# Patient Record
Sex: Male | Born: 1960 | Hispanic: No | Smoking: Current every day smoker
Health system: Southern US, Community
[De-identification: ages and names within clinical notes are randomized; demographics above are authoritative.]

## PROBLEM LIST (undated history)

## (undated) DIAGNOSIS — G8929 Other chronic pain: Secondary | ICD-10-CM

## (undated) DIAGNOSIS — E119 Type 2 diabetes mellitus without complications: Secondary | ICD-10-CM

## (undated) DIAGNOSIS — S46009A Unspecified injury of muscle(s) and tendon(s) of the rotator cuff of unspecified shoulder, initial encounter: Secondary | ICD-10-CM

## (undated) HISTORY — PX: KNEE SURGERY: SHX244

---
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
## 2017-01-18 ENCOUNTER — Encounter (HOSPITAL_COMMUNITY): Payer: Self-pay | Admitting: Emergency Medicine

## 2017-01-18 ENCOUNTER — Emergency Department (HOSPITAL_COMMUNITY)
Admission: EM | Admit: 2017-01-18 | Discharge: 2017-01-18 | Disposition: A | Discharge status: Home / Self Care | Payer: Non-veteran care | Attending: Emergency Medicine | Admitting: Emergency Medicine

## 2017-01-18 ENCOUNTER — Other Ambulatory Visit: Payer: Self-pay

## 2017-01-18 ENCOUNTER — Emergency Department (HOSPITAL_COMMUNITY): Payer: Non-veteran care

## 2017-01-18 DIAGNOSIS — R200 Anesthesia of skin: Secondary | ICD-10-CM | POA: Diagnosis not present

## 2017-01-18 DIAGNOSIS — F17210 Nicotine dependence, cigarettes, uncomplicated: Secondary | ICD-10-CM | POA: Diagnosis not present

## 2017-01-18 DIAGNOSIS — M5412 Radiculopathy, cervical region: Secondary | ICD-10-CM | POA: Insufficient documentation

## 2017-01-18 DIAGNOSIS — M25511 Pain in right shoulder: Secondary | ICD-10-CM | POA: Diagnosis present

## 2017-01-18 DIAGNOSIS — Z79899 Other long term (current) drug therapy: Secondary | ICD-10-CM | POA: Diagnosis not present

## 2017-01-18 LAB — CBC
HCT: 49.1 % (ref 39.0–52.0)
Hemoglobin: 16.3 g/dL (ref 13.0–17.0)
MCH: 28.3 pg (ref 26.0–34.0)
MCHC: 33.2 g/dL (ref 30.0–36.0)
MCV: 85.4 fL (ref 78.0–100.0)
Platelets: 240 10*3/uL (ref 150–400)
RBC: 5.75 MIL/uL (ref 4.22–5.81)
RDW: 14.1 % (ref 11.5–15.5)
WBC: 4.4 10*3/uL (ref 4.0–10.5)

## 2017-01-18 LAB — COMPREHENSIVE METABOLIC PANEL
ALK PHOS: 67 U/L (ref 38–126)
ALT: 28 U/L (ref 17–63)
ANION GAP: 13 (ref 5–15)
AST: 36 U/L (ref 15–41)
Albumin: 3.8 g/dL (ref 3.5–5.0)
BILIRUBIN TOTAL: 0.9 mg/dL (ref 0.3–1.2)
BUN: 10 mg/dL (ref 6–20)
CALCIUM: 9.1 mg/dL (ref 8.9–10.3)
CO2: 26 mmol/L (ref 22–32)
CREATININE: 0.93 mg/dL (ref 0.61–1.24)
Chloride: 101 mmol/L (ref 101–111)
GFR calc Af Amer: >60 mL/min (ref >60)
GFR calc non Af Amer: >60 mL/min (ref >60)
GLUCOSE: 116 mg/dL — AB (ref 65–99)
Potassium: 3.5 mmol/L (ref 3.5–5.1)
SODIUM: 140 mmol/L (ref 135–145)
TOTAL PROTEIN: 6.9 g/dL (ref 6.5–8.1)

## 2017-01-18 LAB — TROPONIN I: Troponin I: <0.03 ng/mL (ref <0.03)

## 2017-01-18 LAB — ETHANOL: Alcohol, Ethyl (B): 67 mg/dL — ABNORMAL HIGH (ref <5)

## 2017-01-18 MED ORDER — PREDNISONE 10 MG (21) PO TBPK: ORAL_TABLET | ORAL | 0 refills | Status: DC

## 2017-01-18 MED ORDER — IBUPROFEN 600 MG PO TABS: 600.0000 mg | ORAL_TABLET | Freq: Four times a day (QID) | ORAL | 0 refills | Status: DC | PRN

## 2017-01-18 MED ORDER — DEXAMETHASONE SODIUM PHOSPHATE 10 MG/ML IJ SOLN
10.0000 mg | Freq: Once | INTRAMUSCULAR | Status: AC
  Administered 2017-01-18: 10 mg via INTRAVENOUS
  Filled 2017-01-18: qty 1

## 2017-01-18 NOTE — ED Provider Notes (Signed)
[facility]-EMERGENCY DEPT Provider Note   CSN: [id number] Arrival date & time: 01/18/17  2033     History   Chief Complaint Chief Complaint  Patient presents with  . right shoulder pain, pain in right leg behind knee    HPI Manuel Long is a 56 y.o. male.  Pt presents to the ED today with right shoulder pain, numbness to right arm, and pain behind right knee.  The pt said he noticed it 2 hours ago.  He was sitting and eating dinner when it started.  The pt said that he does not have any medical problems or take meds.      History reviewed. No pertinent past medical history.  There are no active problems to display for this patient.   Past Surgical History:  Procedure Laterality Date  . KNEE SURGERY Left    basketball injury       Home Medications    Prior to Admission medications   Medication Sig Start Date End Date Taking? Authorizing Provider  ibuprofen (ADVIL,MOTRIN) 600 MG tablet Take 1 tablet (600 mg total) by mouth every 6 (six) hours as needed. 01/18/17   Jacalyn Lefevre, MD  predniSONE (STERAPRED UNI-PAK 21 TAB) 10 MG (21) TBPK tablet Take 6 tabs by mouth daily  for 2 days, then 5 tabs for 2 days, then 4 tabs for 2 days, then 3 tabs for 2 days, 2 tabs for 2 days, then 1 tab by mouth daily for 2 days 01/18/17   Jacalyn Lefevre, MD    Family History No family history on file.  Social History Social History  Substance Use Topics  . Smoking status: Current Every Day Smoker    Packs/day: 0.50    Years: 51.00    Types: Cigarettes  . Smokeless tobacco: Never Used  . Alcohol use 2.4 oz/week    4 Cans of beer per week     Comment: daily     Allergies   Coconut flavor and Bee venom   Review of Systems Review of Systems  Musculoskeletal:       Right shoulder pain  Neurological: Positive for numbness.  All other systems reviewed and are negative.    Physical Exam Updated Vital Signs BP 130/81 (BP Location: Left Arm)   Pulse 91   Temp 98.2 F  (36.8 C) (Oral)   Resp 16   Ht  (1.727 m)   Wt 166 lb 8 oz (75.5 kg)   SpO2 95%   BMI 25.32 kg/m   Physical Exam  Constitutional: He is oriented to person, place, and time. He appears well-developed and well-nourished.  HENT:  Head: Normocephalic and atraumatic.  Right Ear: External ear normal.  Left Ear: External ear normal.  Nose: Nose normal.  Mouth/Throat: Oropharynx is clear and moist.  Eyes: Conjunctivae and EOM are normal. Pupils are equal, round, and reactive to light.  Neck: Normal range of motion. Neck supple.  Cardiovascular: Normal rate, regular rhythm, normal heart sounds and intact distal pulses.   Pulmonary/Chest: Effort normal and breath sounds normal.  Abdominal: Soft. Bowel sounds are normal.  Musculoskeletal:       Right shoulder: He exhibits tenderness.  Neurological: He is alert and oriented to person, place, and time.  Skin: Skin is warm and dry.  Psychiatric: He has a normal mood and affect. His behavior is normal. Judgment and thought content normal.  Nursing note and vitals reviewed.    ED Treatments / Results  Labs (all labs ordered are  listed, but only abnormal results are displayed) Labs Reviewed  COMPREHENSIVE METABOLIC PANEL - Abnormal; Notable for the following:       Result Value   Glucose, Bld 116 (*)    All other components within normal limits  ETHANOL - Abnormal; Notable for the following:    Alcohol, Ethyl (B) 67 (*)    All other components within normal limits  CBC  TROPONIN I  RAPID URINE DRUG SCREEN, HOSP PERFORMED  URINALYSIS, ROUTINE W REFLEX MICROSCOPIC    EKG  EKG Interpretation  Date/Time:  Saturday January 18 2017 20:45:19 EDT Ventricular Rate:  101 PR Interval:  116 QRS Duration: 86 QT Interval:  348 QTC Calculation: 451 R Axis:   25 Text Interpretation:  Sinus tachycardia Otherwise normal ECG Confirmed by Rheba Diamond MD, Odie Edmonds ([id number]) on 01/18/2017 9:43:21 PM       Radiology Dg Shoulder Right  Result  Date: 01/18/2017 CLINICAL DATA:  Acute onset of right shoulder pain, with numbness extending down the right arm. Initial encounter. EXAM: RIGHT SHOULDER - 2+ VIEW COMPARISON:  None. FINDINGS: There is no evidence of fracture or dislocation. The right humeral head is seated within the glenoid fossa. The acromioclavicular joint is unremarkable in appearance. No significant soft tissue abnormalities are seen. The visualized portions of the right lung are clear. IMPRESSION: No evidence of fracture or dislocation. Electronically Signed   By: Roanna Raider M.D.   On: 01/18/2017 22:45   Ct Head Wo Contrast  Result Date: 01/18/2017 CLINICAL DATA:  Right arm numbness EXAM: CT HEAD WITHOUT CONTRAST CT CERVICAL SPINE WITHOUT CONTRAST TECHNIQUE: Multidetector CT imaging of the head and cervical spine was performed following the standard protocol without intravenous contrast. Multiplanar CT image reconstructions of the cervical spine were also generated. COMPARISON:  None. FINDINGS: CT HEAD FINDINGS Brain: No mass lesion, intraparenchymal hemorrhage or extra-axial collection. No evidence of acute cortical infarct. Brain parenchyma and CSF-containing spaces are normal for age. Vascular: No hyperdense vessel or unexpected calcification. Skull: Normal visualized skull base, calvarium and extracranial soft tissues. Sinuses/Orbits: No sinus fluid levels or advanced mucosal thickening. No mastoid effusion. Normal orbits. CT CERVICAL SPINE FINDINGS Alignment: No static subluxation. Facets are aligned. Occipital condyles are normally positioned. Skull base and vertebrae: No acute fracture. Soft tissues and spinal canal: No prevertebral fluid or swelling. No visible canal hematoma. Disc levels: No advanced spinal canal or neural foraminal stenosis. Marked left C2-C3 facet hypertrophy. Upper chest: No pneumothorax, pulmonary nodule or pleural effusion. Other: Normal visualized paraspinal cervical soft tissues. IMPRESSION: 1. Normal  head CT. 2. No acute fracture or static subluxation of the cervical spine. Electronically Signed   By: Deatra Robinson M.D.   On: 01/18/2017 23:16   Ct Cervical Spine Wo Contrast  Result Date: 01/18/2017 CLINICAL DATA:  Right arm numbness EXAM: CT HEAD WITHOUT CONTRAST CT CERVICAL SPINE WITHOUT CONTRAST TECHNIQUE: Multidetector CT imaging of the head and cervical spine was performed following the standard protocol without intravenous contrast. Multiplanar CT image reconstructions of the cervical spine were also generated. COMPARISON:  None. FINDINGS: CT HEAD FINDINGS Brain: No mass lesion, intraparenchymal hemorrhage or extra-axial collection. No evidence of acute cortical infarct. Brain parenchyma and CSF-containing spaces are normal for age. Vascular: No hyperdense vessel or unexpected calcification. Skull: Normal visualized skull base, calvarium and extracranial soft tissues. Sinuses/Orbits: No sinus fluid levels or advanced mucosal thickening. No mastoid effusion. Normal orbits. CT CERVICAL SPINE FINDINGS Alignment: No static subluxation. Facets are aligned. Occipital condyles are normally positioned. Skull  base and vertebrae: No acute fracture. Soft tissues and spinal canal: No prevertebral fluid or swelling. No visible canal hematoma. Disc levels: No advanced spinal canal or neural foraminal stenosis. Marked left C2-C3 facet hypertrophy. Upper chest: No pneumothorax, pulmonary nodule or pleural effusion. Other: Normal visualized paraspinal cervical soft tissues. IMPRESSION: 1. Normal head CT. 2. No acute fracture or static subluxation of the cervical spine. Electronically Signed   By: Deatra Robinson M.D.   On: 01/18/2017 23:16    Procedures Procedures (including critical care time)  Medications Ordered in ED Medications  dexamethasone (DECADRON) injection 10 mg (not administered)     Initial Impression / Assessment and Plan / ED Course  I have reviewed the triage vital signs and the nursing  notes.  Pertinent labs & imaging results that were available during my care of the patient were reviewed by me and considered in my medical decision making (see chart for details).    Pt is feeling better.  Sx c/w radiculopathy.  He is instr to return if worse.  F/u with pcp.  Final Clinical Impressions(s) / ED Diagnoses   Final diagnoses:  Cervical radiculopathy    New Prescriptions New Prescriptions   IBUPROFEN (ADVIL,MOTRIN) 600 MG TABLET    Take 1 tablet (600 mg total) by mouth every 6 (six) hours as needed.   PREDNISONE (STERAPRED UNI-PAK 21 TAB) 10 MG (21) TBPK TABLET    Take 6 tabs by mouth daily  for 2 days, then 5 tabs for 2 days, then 4 tabs for 2 days, then 3 tabs for 2 days, 2 tabs for 2 days, then 1 tab by mouth daily for 2 days     Jacalyn Lefevre, MD 01/18/17 2327

## 2019-09-17 ENCOUNTER — Encounter (HOSPITAL_COMMUNITY): Payer: Self-pay | Admitting: Emergency Medicine

## 2019-09-17 ENCOUNTER — Emergency Department (HOSPITAL_COMMUNITY)
Admission: EM | Admit: 2019-09-17 | Discharge: 2019-09-17 | Disposition: A | Discharge status: Home / Self Care | Payer: No Typology Code available for payment source | Attending: Emergency Medicine | Admitting: Emergency Medicine

## 2019-09-17 ENCOUNTER — Other Ambulatory Visit: Payer: Self-pay

## 2019-09-17 DIAGNOSIS — R202 Paresthesia of skin: Secondary | ICD-10-CM | POA: Insufficient documentation

## 2019-09-17 DIAGNOSIS — M5412 Radiculopathy, cervical region: Secondary | ICD-10-CM | POA: Diagnosis not present

## 2019-09-17 DIAGNOSIS — M25511 Pain in right shoulder: Secondary | ICD-10-CM | POA: Diagnosis present

## 2019-09-17 DIAGNOSIS — F17210 Nicotine dependence, cigarettes, uncomplicated: Secondary | ICD-10-CM | POA: Diagnosis not present

## 2019-09-17 MED ORDER — ACETAMINOPHEN 500 MG PO TABS
1000.0000 mg | ORAL_TABLET | Freq: Once | ORAL | Status: AC
  Administered 2019-09-17: 1000 mg via ORAL
  Filled 2019-09-17: qty 2

## 2019-09-17 MED ORDER — PREDNISONE 10 MG (21) PO TBPK: ORAL_TABLET | ORAL | 0 refills | Status: DC

## 2019-09-17 MED ORDER — METHOCARBAMOL 500 MG PO TABS: 500.0000 mg | ORAL_TABLET | Freq: Two times a day (BID) | ORAL | 0 refills | Status: DC

## 2019-09-17 MED ORDER — LIDOCAINE 5 % EX PTCH
1.0000 | MEDICATED_PATCH | CUTANEOUS | Status: DC
  Administered 2019-09-17: 22:00:00 1 via TRANSDERMAL
  Filled 2019-09-17: qty 1

## 2019-09-17 MED ORDER — DEXAMETHASONE SODIUM PHOSPHATE 10 MG/ML IJ SOLN
10.0000 mg | Freq: Once | INTRAMUSCULAR | Status: AC
  Administered 2019-09-17: 10 mg via INTRAMUSCULAR
  Filled 2019-09-17: qty 1

## 2019-09-17 NOTE — ED Provider Notes (Signed)
[facility]-EMERGENCY DEPT Provider Note   CSN: [id number] Arrival date & time: 09/17/19  1739     History Chief Complaint  Patient presents with  . Shoulder Pain    Manuel Long is a 58 y.o. male.  Manuel Long is a 58 y.o. male who is otherwise healthy, presents to the ED for evaluation of right shoulder pain with associated tingling in his fourth and fifth fingers that started about an hour prior to arrival.  Chart review reveals patient was seen for similar symptoms in April 2018 and patient states that this feels pretty much the same.  He denies any injury to the shoulder preceding this pain.  Has not done any recent strenuous activity or heavy lifting he denies associated neck pain but reports pain across the top of the shoulder over the trapezius muscle with pain radiating through the arm and into the fourth and fifth finger.  He denies noting any swelling, redness or warmth.  He is able to move the elbow wrist and shoulder and denies any weakness.  He has had tingling is reported but denies any complete numbness.  He has not taken any medications to treat his symptoms prior to arrival.  Reports that with previous episode in 2018 he was diagnosed with cervical radiculopathy and treated with steroids and had resolution of his symptoms.  No other aggravating or alleviating factors.        History reviewed. No pertinent past medical history.  There are no problems to display for this patient.   Past Surgical History:  Procedure Laterality Date  . KNEE SURGERY Left    basketball injury       No family history on file.  Social History   Tobacco Use  . Smoking status: Current Every Day Smoker    Packs/day: 0.50    Years: 51.00    Pack years: 25.50    Types: Cigarettes  . Smokeless tobacco: Never Used  Substance Use Topics  . Alcohol use: Yes    Alcohol/week: 4.0 standard drinks    Types: 4 Cans of beer per week    Comment: daily  .  Drug use: Not on file    Home Medications Prior to Admission medications   Medication Sig Start Date End Date Taking? Authorizing Provider  ibuprofen (ADVIL,MOTRIN) 600 MG tablet Take 1 tablet (600 mg total) by mouth every 6 (six) hours as needed. 01/18/17   Jacalyn LefevreHaviland, Julie, MD  methocarbamol (ROBAXIN) 500 MG tablet Take 1 tablet (500 mg total) by mouth 2 (two) times daily. 09/17/19   Dartha LodgeFord, Keywon Mestre N, PA-C  predniSONE (STERAPRED UNI-PAK 21 TAB) 10 MG (21) TBPK tablet Take 6 tabs by mouth daily  for 2 days, then 5 tabs for 2 days, then 4 tabs for 2 days, then 3 tabs for 2 days, 2 tabs for 2 days, then 1 tab by mouth daily for 2 days 09/17/19   Dartha LodgeFord, Seeley [place] N, PA-C    Allergies    Coconut flavor and Bee venom  Review of Systems   Review of Systems  Constitutional: Negative for chills and fever.  Respiratory: Negative for shortness of breath.   Cardiovascular: Negative for chest pain.  Musculoskeletal: Positive for arthralgias. Negative for joint swelling.  Skin: Negative for color change and rash.  Neurological: Negative for weakness and numbness.       Tingling and paresthesias in the right arm and fingers    Physical Exam Updated Vital Signs BP (!) 142/93  Pulse 68   Temp 98.2 F (36.8 C)   Resp 20   SpO2 98%   Physical Exam Vitals and nursing note reviewed.  Constitutional:      General: He is not in acute distress.    Appearance: Normal appearance. He is well-developed and normal weight. He is not diaphoretic.  HENT:     Head: Normocephalic and atraumatic.  Eyes:     General:        Right eye: No discharge.        Left eye: No discharge.  Neck:     Comments: No midline C-spine tenderness or tenderness over the paraspinal muscles Pulmonary:     Effort: Pulmonary effort is normal. No respiratory distress.  Musculoskeletal:     Comments: Tenderness to palpation across the top of the right shoulder, no focal bony tenderness or palpable deformity, no swelling or  erythema.  No tenderness through the upper arm or forearm.  Patient has 5/5 grip strength and normal strength with flexion and extension of the arm.  2+ radial pulse and good cap refill.  Sensation intact with reported tingling and paresthesias in the fourth and fifth finger.  Skin:    General: Skin is warm and dry.  Neurological:     Mental Status: He is alert and oriented to person, place, and time.     Coordination: Coordination normal.  Psychiatric:        Mood and Affect: Mood normal.        Behavior: Behavior normal.     ED Results / Procedures / Treatments   Labs (all labs ordered are listed, but only abnormal results are displayed) Labs Reviewed - No data to display  EKG None  Radiology No results found.  Procedures Procedures (including critical care time)  Medications Ordered in ED Medications  dexamethasone (DECADRON) injection 10 mg (has no administration in time range)  acetaminophen (TYLENOL) tablet 1,000 mg (has no administration in time range)  lidocaine (LIDODERM) 5 % 1 patch (has no administration in time range)    ED Course  I have reviewed the triage vital signs and the nursing notes.  Pertinent labs & imaging results that were available during my care of the patient were reviewed by me and considered in my medical decision making (see chart for details).    MDM Rules/Calculators/A&P  58 year old male presents with pain in the right shoulder, no associated trauma or injury.  He reports associated tingling and paresthesias in the fourth and fifth finger had similar symptoms in 2018 and was diagnosed with cervical radiculopathy and had resolution after treatment with steroids.  Presentation today is consistent with cervical radiculopathy as well and he does not have any weakness on exam.  No focal pain or tenderness over the neck.  Patient will be treated with steroids, Tylenol and lidocaine patch, discharged with steroids and muscle relaxer.  With PCP  follow-up if not improving.  Return precautions discussed.  Discharged home in good condition.  Final Clinical Impression(s) / ED Diagnoses Final diagnoses:  Cervical radiculopathy  Acute pain of right shoulder    Rx / DC Orders ED Discharge Orders         Ordered    predniSONE (STERAPRED UNI-PAK 21 TAB) 10 MG (21) TBPK tablet     09/17/19 2049    methocarbamol (ROBAXIN) 500 MG tablet  2 times daily     09/17/19 2049           Dartha Lodge, [place] 09/17/19  2100    Valarie Merino, MD 09/30/19 1807

## 2019-09-17 NOTE — ED Triage Notes (Signed)
Patient reports pain to right shoulder x1 hour. Denies trauma and injury. Reports pain worsens with movement.

## 2019-09-17 NOTE — Discharge Instructions (Signed)
Your symptoms are likely due to a cervical radiculopathy (pinched nerve in the neck).  Please take steroids as directed, you can also use Tylenol 1000 mg every 8 hours and over-the-counter salon pas lidocaine patches which can be worn on for 12 hours and then taken off for 12 hours.  His symptoms are not improving please follow-up with your primary care doctor for further evaluation.  Return to the ED if you develop weakness or any other new or concerning symptoms.

## 2020-07-31 ENCOUNTER — Emergency Department (HOSPITAL_COMMUNITY): Payer: No Typology Code available for payment source

## 2020-07-31 ENCOUNTER — Emergency Department (HOSPITAL_COMMUNITY)
Admission: EM | Admit: 2020-07-31 | Discharge: 2020-07-31 | Disposition: A | Discharge status: Home / Self Care | Payer: No Typology Code available for payment source | Attending: Emergency Medicine | Admitting: Emergency Medicine

## 2020-07-31 ENCOUNTER — Encounter (HOSPITAL_COMMUNITY): Payer: Self-pay

## 2020-07-31 ENCOUNTER — Other Ambulatory Visit: Payer: Self-pay

## 2020-07-31 DIAGNOSIS — S39012A Strain of muscle, fascia and tendon of lower back, initial encounter: Secondary | ICD-10-CM | POA: Diagnosis not present

## 2020-07-31 DIAGNOSIS — Y92410 Unspecified street and highway as the place of occurrence of the external cause: Secondary | ICD-10-CM | POA: Insufficient documentation

## 2020-07-31 DIAGNOSIS — S46912A Strain of unspecified muscle, fascia and tendon at shoulder and upper arm level, left arm, initial encounter: Secondary | ICD-10-CM | POA: Insufficient documentation

## 2020-07-31 DIAGNOSIS — S4992XA Unspecified injury of left shoulder and upper arm, initial encounter: Secondary | ICD-10-CM | POA: Diagnosis present

## 2020-07-31 DIAGNOSIS — F17210 Nicotine dependence, cigarettes, uncomplicated: Secondary | ICD-10-CM | POA: Diagnosis not present

## 2020-07-31 LAB — COMPREHENSIVE METABOLIC PANEL
ALT: 38 U/L (ref 0–44)
AST: 31 U/L (ref 15–41)
Albumin: 4.5 g/dL (ref 3.5–5.0)
Alkaline Phosphatase: 73 U/L (ref 38–126)
Anion gap: 12 (ref 5–15)
BUN: 16 mg/dL (ref 8–23)
CO2: 24 mmol/L (ref 22–32)
Calcium: 9.4 mg/dL (ref 8.9–10.3)
Chloride: 101 mmol/L (ref 98–111)
Creatinine, Ser: 0.92 mg/dL (ref 0.61–1.24)
GFR, Estimated: >60 mL/min (ref >60)
Glucose, Bld: 119 mg/dL — ABNORMAL HIGH (ref 70–99)
Potassium: 4.2 mmol/L (ref 3.5–5.1)
Sodium: 137 mmol/L (ref 135–145)
Total Bilirubin: 0.7 mg/dL (ref 0.3–1.2)
Total Protein: 8.1 g/dL (ref 6.5–8.1)

## 2020-07-31 LAB — CBC WITH DIFFERENTIAL/PLATELET
Abs Immature Granulocytes: 0.01 10*3/uL (ref 0.00–0.07)
Basophils Absolute: 0 10*3/uL (ref 0.0–0.1)
Basophils Relative: 1 %
Eosinophils Absolute: 0.1 10*3/uL (ref 0.0–0.5)
Eosinophils Relative: 2 %
HCT: 50.8 % (ref 39.0–52.0)
Hemoglobin: 16.9 g/dL (ref 13.0–17.0)
Immature Granulocytes: 0 %
Lymphocytes Relative: 24 %
Lymphs Abs: 1.3 10*3/uL (ref 0.7–4.0)
MCH: 28 pg (ref 26.0–34.0)
MCHC: 33.3 g/dL (ref 30.0–36.0)
MCV: 84.2 fL (ref 80.0–100.0)
Monocytes Absolute: 0.5 10*3/uL (ref 0.1–1.0)
Monocytes Relative: 9 %
Neutro Abs: 3.5 10*3/uL (ref 1.7–7.7)
Neutrophils Relative %: 64 %
Platelets: 267 10*3/uL (ref 150–400)
RBC: 6.03 MIL/uL — ABNORMAL HIGH (ref 4.22–5.81)
RDW: 13.8 % (ref 11.5–15.5)
WBC: 5.4 10*3/uL (ref 4.0–10.5)
nRBC: 0 % (ref 0.0–0.2)

## 2020-07-31 MED ORDER — OXYCODONE-ACETAMINOPHEN 5-325 MG PO TABS
1.0000 | ORAL_TABLET | Freq: Once | ORAL | Status: DC
Start: 2020-07-31 — End: 2020-07-31

## 2020-07-31 MED ORDER — OXYCODONE-ACETAMINOPHEN 5-325 MG PO TABS: 1.0000 | ORAL_TABLET | ORAL | 0 refills | Status: AC | PRN

## 2020-07-31 MED ORDER — OXYCODONE-ACETAMINOPHEN 5-325 MG PO TABS
1.0000 | ORAL_TABLET | ORAL | 0 refills | Status: DC | PRN
Start: 2020-07-31 — End: 2020-07-31

## 2020-07-31 MED ORDER — OXYCODONE-ACETAMINOPHEN 5-325 MG PO TABS
2.0000 | ORAL_TABLET | Freq: Once | ORAL | Status: AC
  Administered 2020-07-31: 2 via ORAL
  Filled 2020-07-31: qty 2
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
## 2020-07-31 NOTE — Progress Notes (Signed)
Orthopedic Tech Progress Note Patient Details:  Anil Havard [facility] 01/05/1961 [id number]  Ortho Devices Type of Ortho Device: Shoulder immobilizer Ortho Device/Splint Location: left Ortho Device/Splint Interventions: Application   Post Interventions Patient Tolerated: Well Instructions Provided: Care of device   Saul Fordyce 07/31/2020, 12:51 PM

## 2020-07-31 NOTE — ED Notes (Signed)
Pt transported to CT ?

## 2020-07-31 NOTE — Discharge Instructions (Addendum)
Manuel Long, all of your imaging came back negative for any fracture.  This is likely just muscle strain.  Please take ibuprofen or Tylenol as needed for pain.  You can take Percocet as needed for severe pain.  Please follow-up with your primary care physician in the [place].  Come back to ED for worsening pain.  Take care

## 2021-01-21 ENCOUNTER — Emergency Department (HOSPITAL_COMMUNITY): Payer: No Typology Code available for payment source

## 2021-01-21 ENCOUNTER — Other Ambulatory Visit: Payer: Self-pay

## 2021-01-21 ENCOUNTER — Encounter (HOSPITAL_COMMUNITY): Payer: Self-pay

## 2021-01-21 ENCOUNTER — Inpatient Hospital Stay (HOSPITAL_COMMUNITY)
Admission: EM | Admit: 2021-01-21 | Discharge: 2021-01-24 | DRG: 981 | Disposition: A | Discharge status: Home / Self Care | Payer: No Typology Code available for payment source | Attending: Internal Medicine | Admitting: Internal Medicine

## 2021-01-21 DIAGNOSIS — I82442 Acute embolism and thrombosis of left tibial vein: Secondary | ICD-10-CM | POA: Diagnosis present

## 2021-01-21 DIAGNOSIS — Z79899 Other long term (current) drug therapy: Secondary | ICD-10-CM | POA: Diagnosis not present

## 2021-01-21 DIAGNOSIS — I5020 Unspecified systolic (congestive) heart failure: Secondary | ICD-10-CM | POA: Diagnosis present

## 2021-01-21 DIAGNOSIS — M25512 Pain in left shoulder: Secondary | ICD-10-CM | POA: Diagnosis present

## 2021-01-21 DIAGNOSIS — I272 Pulmonary hypertension, unspecified: Secondary | ICD-10-CM | POA: Diagnosis present

## 2021-01-21 DIAGNOSIS — E871 Hypo-osmolality and hyponatremia: Secondary | ICD-10-CM | POA: Diagnosis present

## 2021-01-21 DIAGNOSIS — R079 Chest pain, unspecified: Secondary | ICD-10-CM | POA: Diagnosis not present

## 2021-01-21 DIAGNOSIS — F17210 Nicotine dependence, cigarettes, uncomplicated: Secondary | ICD-10-CM | POA: Diagnosis present

## 2021-01-21 DIAGNOSIS — E1110 Type 2 diabetes mellitus with ketoacidosis without coma: Principal | ICD-10-CM | POA: Diagnosis present

## 2021-01-21 DIAGNOSIS — R778 Other specified abnormalities of plasma proteins: Secondary | ICD-10-CM | POA: Diagnosis not present

## 2021-01-21 DIAGNOSIS — I82432 Acute embolism and thrombosis of left popliteal vein: Secondary | ICD-10-CM | POA: Diagnosis present

## 2021-01-21 DIAGNOSIS — R739 Hyperglycemia, unspecified: Secondary | ICD-10-CM

## 2021-01-21 DIAGNOSIS — I5082 Biventricular heart failure: Secondary | ICD-10-CM | POA: Diagnosis present

## 2021-01-21 DIAGNOSIS — Z833 Family history of diabetes mellitus: Secondary | ICD-10-CM

## 2021-01-21 DIAGNOSIS — I2602 Saddle embolus of pulmonary artery with acute cor pulmonale: Secondary | ICD-10-CM | POA: Diagnosis not present

## 2021-01-21 DIAGNOSIS — G8929 Other chronic pain: Secondary | ICD-10-CM | POA: Diagnosis present

## 2021-01-21 DIAGNOSIS — R000 Tachycardia, unspecified: Secondary | ICD-10-CM | POA: Diagnosis not present

## 2021-01-21 DIAGNOSIS — R5383 Other fatigue: Secondary | ICD-10-CM | POA: Diagnosis present

## 2021-01-21 DIAGNOSIS — I471 Supraventricular tachycardia: Secondary | ICD-10-CM | POA: Diagnosis not present

## 2021-01-21 DIAGNOSIS — I82462 Acute embolism and thrombosis of left calf muscular vein: Secondary | ICD-10-CM | POA: Diagnosis present

## 2021-01-21 DIAGNOSIS — Z91018 Allergy to other foods: Secondary | ICD-10-CM

## 2021-01-21 DIAGNOSIS — E876 Hypokalemia: Secondary | ICD-10-CM | POA: Diagnosis not present

## 2021-01-21 DIAGNOSIS — N179 Acute kidney failure, unspecified: Secondary | ICD-10-CM | POA: Diagnosis present

## 2021-01-21 DIAGNOSIS — E860 Dehydration: Secondary | ICD-10-CM | POA: Diagnosis present

## 2021-01-21 DIAGNOSIS — I2699 Other pulmonary embolism without acute cor pulmonale: Secondary | ICD-10-CM | POA: Diagnosis not present

## 2021-01-21 DIAGNOSIS — Z91030 Bee allergy status: Secondary | ICD-10-CM | POA: Diagnosis not present

## 2021-01-21 DIAGNOSIS — R7989 Other specified abnormal findings of blood chemistry: Secondary | ICD-10-CM | POA: Diagnosis present

## 2021-01-21 DIAGNOSIS — Z20822 Contact with and (suspected) exposure to covid-19: Secondary | ICD-10-CM | POA: Diagnosis present

## 2021-01-21 DIAGNOSIS — Z720 Tobacco use: Secondary | ICD-10-CM | POA: Diagnosis present

## 2021-01-21 DIAGNOSIS — I5022 Chronic systolic (congestive) heart failure: Secondary | ICD-10-CM | POA: Diagnosis not present

## 2021-01-21 HISTORY — DX: Type 2 diabetes mellitus without complications: E11.9

## 2021-01-21 HISTORY — DX: Unspecified injury of muscle(s) and tendon(s) of the rotator cuff of unspecified shoulder, initial encounter: S46.009A

## 2021-01-21 HISTORY — DX: Other chronic pain: G89.29

## 2021-01-21 LAB — CBC
HCT: 55 % — ABNORMAL HIGH (ref 39.0–52.0)
HCT: 46.3 % (ref 39.0–52.0)
Hemoglobin: 16.9 g/dL (ref 13.0–17.0)
Hemoglobin: 15.7 g/dL (ref 13.0–17.0)
MCH: 27.4 pg (ref 26.0–34.0)
MCH: 27.8 pg (ref 26.0–34.0)
MCHC: 30.7 g/dL (ref 30.0–36.0)
MCHC: 33.9 g/dL (ref 30.0–36.0)
MCV: 89.3 fL (ref 80.0–100.0)
MCV: 82.1 fL (ref 80.0–100.0)
Platelets: 195 10*3/uL (ref 150–400)
Platelets: 179 10*3/uL (ref 150–400)
RBC: 6.16 MIL/uL — ABNORMAL HIGH (ref 4.22–5.81)
RBC: 5.64 MIL/uL (ref 4.22–5.81)
RDW: 13.2 % (ref 11.5–15.5)
RDW: 12.8 % (ref 11.5–15.5)
WBC: 5.4 10*3/uL (ref 4.0–10.5)
WBC: 5.3 10*3/uL (ref 4.0–10.5)
nRBC: 0 % (ref 0.0–0.2)
nRBC: 0 % (ref 0.0–0.2)

## 2021-01-21 LAB — GLUCOSE, CAPILLARY
Glucose-Capillary: 344 mg/dL — ABNORMAL HIGH (ref 70–99)
Glucose-Capillary: 270 mg/dL — ABNORMAL HIGH (ref 70–99)
Glucose-Capillary: 226 mg/dL — ABNORMAL HIGH (ref 70–99)

## 2021-01-21 LAB — BASIC METABOLIC PANEL
Anion gap: 17 — ABNORMAL HIGH (ref 5–15)
Anion gap: 16 — ABNORMAL HIGH (ref 5–15)
Anion gap: 12 (ref 5–15)
BUN: 14 mg/dL (ref 8–23)
BUN: 12 mg/dL (ref 8–23)
BUN: 10 mg/dL (ref 8–23)
CO2: 24 mmol/L (ref 22–32)
CO2: 20 mmol/L — ABNORMAL LOW (ref 22–32)
CO2: 22 mmol/L (ref 22–32)
Calcium: 9.4 mg/dL (ref 8.9–10.3)
Calcium: 9.1 mg/dL (ref 8.9–10.3)
Calcium: 8.9 mg/dL (ref 8.9–10.3)
Chloride: 81 mmol/L — ABNORMAL LOW (ref 98–111)
Chloride: 93 mmol/L — ABNORMAL LOW (ref 98–111)
Chloride: 98 mmol/L (ref 98–111)
Creatinine, Ser: 1.42 mg/dL — ABNORMAL HIGH (ref 0.61–1.24)
Creatinine, Ser: 1.15 mg/dL (ref 0.61–1.24)
Creatinine, Ser: 0.87 mg/dL (ref 0.61–1.24)
GFR, Estimated: 56 mL/min — ABNORMAL LOW (ref >60)
GFR, Estimated: >60 mL/min (ref >60)
GFR, Estimated: >60 mL/min (ref >60)
Glucose, Bld: 1092 mg/dL (ref 70–99)
Glucose, Bld: 570 mg/dL (ref 70–99)
Glucose, Bld: 321 mg/dL — ABNORMAL HIGH (ref 70–99)
Potassium: 4.7 mmol/L (ref 3.5–5.1)
Potassium: 4.4 mmol/L (ref 3.5–5.1)
Potassium: 3.8 mmol/L (ref 3.5–5.1)
Sodium: 122 mmol/L — ABNORMAL LOW (ref 135–145)
Sodium: 129 mmol/L — ABNORMAL LOW (ref 135–145)
Sodium: 132 mmol/L — ABNORMAL LOW (ref 135–145)

## 2021-01-21 LAB — CBG MONITORING, ED
Glucose-Capillary: >600 mg/dL (ref 70–99)
Glucose-Capillary: >600 mg/dL (ref 70–99)
Glucose-Capillary: 581 mg/dL (ref 70–99)
Glucose-Capillary: 583 mg/dL (ref 70–99)

## 2021-01-21 LAB — URINALYSIS, ROUTINE W REFLEX MICROSCOPIC
Bacteria, UA: NONE SEEN
Bilirubin Urine: NEGATIVE
Glucose, UA: >=500 mg/dL — AB
Hgb urine dipstick: NEGATIVE
Ketones, ur: 5 mg/dL — AB
Leukocytes,Ua: NEGATIVE
Nitrite: NEGATIVE
Protein, ur: NEGATIVE mg/dL
Specific Gravity, Urine: 1.028 (ref 1.005–1.030)
pH: 5 (ref 5.0–8.0)

## 2021-01-21 LAB — BLOOD GAS, VENOUS
Acid-base deficit: 3 mmol/L — ABNORMAL HIGH (ref 0.0–2.0)
Bicarbonate: 21.8 mmol/L (ref 20.0–28.0)
O2 Saturation: 94.9 %
Patient temperature: 98.6
pCO2, Ven: 40.3 mmHg — ABNORMAL LOW (ref 44.0–60.0)
pH, Ven: 7.353 (ref 7.250–7.430)
pO2, Ven: 80.3 mmHg — ABNORMAL HIGH (ref 32.0–45.0)

## 2021-01-21 LAB — TROPONIN I (HIGH SENSITIVITY)
Troponin I (High Sensitivity): 199 ng/L (ref <18)
Troponin I (High Sensitivity): 188 ng/L (ref <18)
Troponin I (High Sensitivity): 192 ng/L (ref <18)

## 2021-01-21 LAB — OSMOLALITY: Osmolality: 331 mOsm/kg (ref 275–295)

## 2021-01-21 LAB — BETA-HYDROXYBUTYRIC ACID
Beta-Hydroxybutyric Acid: 2.76 mmol/L — ABNORMAL HIGH (ref 0.05–0.27)
Beta-Hydroxybutyric Acid: 0.55 mmol/L — ABNORMAL HIGH (ref 0.05–0.27)

## 2021-01-21 MED ORDER — INSULIN REGULAR(HUMAN) IN NACL 100-0.9 UT/100ML-% IV SOLN: INTRAVENOUS | Status: DC

## 2021-01-21 MED ORDER — LACTATED RINGERS IV BOLUS: 20.0000 mL/kg | Freq: Once | INTRAVENOUS | Status: DC

## 2021-01-21 MED ORDER — CYCLOBENZAPRINE HCL 10 MG PO TABS
10.0000 mg | ORAL_TABLET | Freq: Three times a day (TID) | ORAL | Status: DC | PRN
  Filled 2021-01-21: qty 1

## 2021-01-21 MED ORDER — ASPIRIN 81 MG PO CHEW
324.0000 mg | CHEWABLE_TABLET | Freq: Once | ORAL | Status: AC
  Administered 2021-01-21: 324 mg via ORAL
  Filled 2021-01-21: qty 4

## 2021-01-21 MED ORDER — DEXTROSE IN LACTATED RINGERS 5 % IV SOLN: INTRAVENOUS | Status: DC

## 2021-01-21 MED ORDER — LACTATED RINGERS IV SOLN: INTRAVENOUS | Status: DC

## 2021-01-21 MED ORDER — DEXTROSE 50 % IV SOLN: 0.0000 mL | INTRAVENOUS | Status: DC | PRN

## 2021-01-21 MED ORDER — POTASSIUM CHLORIDE 10 MEQ/100ML IV SOLN
10.0000 meq | INTRAVENOUS | Status: AC
  Administered 2021-01-21: 10 meq via INTRAVENOUS
  Filled 2021-01-21: qty 100

## 2021-01-21 MED ORDER — ENOXAPARIN SODIUM 40 MG/0.4ML ~~LOC~~ SOLN
40.0000 mg | SUBCUTANEOUS | Status: DC
  Administered 2021-01-21: 40 mg via SUBCUTANEOUS
  Filled 2021-01-21: qty 0.4

## 2021-01-21 MED ORDER — INSULIN REGULAR(HUMAN) IN NACL 100-0.9 UT/100ML-% IV SOLN
INTRAVENOUS | Status: DC
  Administered 2021-01-21: 6 [IU]/h via INTRAVENOUS
  Filled 2021-01-21: qty 100

## 2021-01-21 MED ORDER — LACTATED RINGERS IV BOLUS
2000.0000 mL | Freq: Once | INTRAVENOUS | Status: AC
  Administered 2021-01-21: 2000 mL via INTRAVENOUS

## 2021-01-21 NOTE — ED Triage Notes (Signed)
Patient c/o "feeling tired all the time" x 1 week. Patient states, "I have a cold or something."

## 2021-01-21 NOTE — Plan of Care (Signed)

## 2021-01-21 NOTE — ED Notes (Signed)
Glucose 1092, Trop 199, lab results, MD notified

## 2021-01-21 NOTE — ED Notes (Signed)
Per lab Trop 188 MD notified
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
## 2021-01-21 NOTE — ED Notes (Addendum)
Meds are late due to lack of IV access earlier in shift. Three different RNs including writer attempted with no success.

## 2021-01-21 NOTE — H&P (Signed)
History and Physical   Manuel Long DPO:[id number] DOB: 1960/11/01 DOA: 01/21/2021  Referring MD/NP/PA: Dr. Charm Barges  PCP: [facility]   Outpatient Specialists: None  Patient coming from: Home  Chief Complaint: Left shoulder pain  HPI: Manuel Long is a 60 y.o. male with medical history significant of left shoulder pain which is chronic who presented to the ER complaining of fatigue polydipsia polyphagia and polyuria.  Patient also reported 5 pound weight loss in the last months.  He was feeling weak and debilitated.  Patient was seen and evaluated in the ER.  He was found to have blood sugar of more than 8000.  Not in nondiabetic.  Subsequently initiated on IV insulin per medicine consult: [place].  He has anion gap of 17 and beta hydroxybutyric acid elevated.  His pH is 7.3 not quite acidotic but seems compensated with concomitant alkalosis from contraction.  Patient reported both parents were diabetic but he was never diagnosed with diabetes will be admitted to the hospital for further treatment..  ED Course: Temperature 98.1, blood pressure 114/103, pulse 148, respirate 24 and oxygen sat 93% room air.  White count 5.3, hemoglobin 10.7 and platelets 179.  Sodium 122.  The rest of chemistry appear to be within normal except glucose 1092, BUN 14 creatinine 1.42.  Beta hydroxybutyric acid 2.76.  Urinalysis shows significant glucosuria.  Venous pH is 7.353.  Patient being admitted with new diagnosis of diabetes and DKA  Review of Systems: As per HPI otherwise 10 point review of systems negative.    Past Medical History:  Diagnosis Date  . Chronic shoulder pain     Past Surgical History:  Procedure Laterality Date  . KNEE SURGERY Left    basketball injury     reports that he has been smoking cigarettes. He has a 25.50 pack-year smoking history. He has never used smokeless tobacco. He reports current alcohol use of about 4.0 standard drinks of alcohol per week. He  reports that he does not use drugs.  Allergies  Allergen Reactions  . Coconut Flavor Itching  . Bee Venom Rash    Family History  Problem Relation Age of Onset  . Diabetes Mother      Prior to Admission medications   Medication Sig Start Date End Date Taking? Authorizing Provider  cyclobenzaprine (FLEXERIL) 10 MG tablet Take 10 mg by mouth 3 (three) times daily as needed for muscle spasms. 09/05/20  Yes [provider]  naproxen (NAPROSYN) 500 MG tablet Take 500 mg by mouth 2 (two) times daily as needed for moderate pain. 12/19/20  Yes [provider]  sildenafil (VIAGRA) 100 MG tablet Take 50 mg by mouth as needed for erectile dysfunction. 12/01/20  Yes [provider]  ibuprofen (ADVIL,MOTRIN) 600 MG tablet Take 1 tablet (600 mg total) by mouth every 6 (six) hours as needed. Patient not taking: Reported on 01/21/2021 01/18/17   Jacalyn Lefevre, MD  methocarbamol (ROBAXIN) 500 MG tablet Take 1 tablet (500 mg total) by mouth 2 (two) times daily. Patient not taking: Reported on 01/21/2021 09/17/19   Dartha Lodge, PA-C  predniSONE (STERAPRED UNI-PAK 21 TAB) 10 MG (21) TBPK tablet Take 6 tabs by mouth daily  for 2 days, then 5 tabs for 2 days, then 4 tabs for 2 days, then 3 tabs for 2 days, 2 tabs for 2 days, then 1 tab by mouth daily for 2 days Patient not taking: Reported on 01/21/2021 09/17/19   Dartha Lodge, PA-C  Physical Exam: Vitals:   01/21/21 1611 01/21/21 1730 01/21/21 1800 01/21/21 1845  BP:  (!) 112/93 110/90 116/87  Pulse:  (!) 127 (!) 117 (!) 117  Resp:  19 18 17   Temp:      TempSrc:      SpO2:  98% 94% 97%  Weight: 83.9 kg     Height: 5\' 8"  (1.727 m)         Constitutional: Weak, withdrawn, no distress Vitals:   01/21/21 1611 01/21/21 1730 01/21/21 1800 01/21/21 1845  BP:  (!) 112/93 110/90 116/87  Pulse:  (!) 127 (!) 117 (!) 117  Resp:  19 18 17   Temp:      TempSrc:      SpO2:  98% 94% 97%  Weight: 83.9 kg     Height: 5\' 8"   (1.727 m)      Eyes: PERRL, lids and conjunctivae normal ENMT: Mucous membranes are dry. Posterior pharynx clear of any exudate or lesions.Normal dentition.  Neck: normal, supple, no masses, no thyromegaly Respiratory: clear to auscultation bilaterally, no wheezing, no crackles. Normal respiratory effort. No accessory muscle use.  Cardiovascular: Sinus tachycardia no murmurs / rubs / gallops. No extremity edema. 2+ pedal pulses. No carotid bruits.  Abdomen: no tenderness, no masses palpated. No hepatosplenomegaly. Bowel sounds positive.  Musculoskeletal: no clubbing / cyanosis. No joint deformity upper and lower extremities. Good ROM, no contractures. Normal muscle tone.  Skin: no rashes, lesions, ulcers. No induration Neurologic: CN 2-12 grossly intact. Sensation intact, DTR normal. Strength 5/5 in all 4.  Psychiatric: Normal judgment and insight. Alert and oriented x 3. Normal mood.     Labs on Admission: I have personally reviewed following labs and imaging studies  CBC: Recent Labs  Lab 01/21/21 1623  WBC 5.4  HGB 16.9  HCT 55.0*  MCV 89.3  PLT 195   Basic Metabolic Panel: Recent Labs  Lab 01/21/21 1623  NA 122*  K 4.7  CL 81*  CO2 24  GLUCOSE 1,092*  BUN 14  CREATININE 1.42*  CALCIUM 9.4   GFR: Estimated Creatinine Clearance: 57.6 mL/min (A) (by C-G formula based on SCr of 1.42 mg/dL (H)). Liver Function Tests: No results for input(s): AST, ALT, ALKPHOS, BILITOT, PROT, ALBUMIN in the last 168 hours. No results for input(s): LIPASE, AMYLASE in the last 168 hours. No results for input(s): AMMONIA in the last 168 hours. Coagulation Profile: No results for input(s): INR, PROTIME in the last 168 hours. Cardiac Enzymes: No results for input(s): CKTOTAL, CKMB, CKMBINDEX, TROPONINI in the last 168 hours. BNP (last 3 results) No results for input(s): PROBNP in the last 8760 hours. HbA1C: No results for input(s): HGBA1C in the last 72 hours. CBG: Recent Labs  Lab  01/21/21 1635 01/21/21 1751 01/21/21 1845  GLUCAP >600* >600* 581*   Lipid Profile: No results for input(s): CHOL, HDL, LDLCALC, TRIG, CHOLHDL, LDLDIRECT in the last 72 hours. Thyroid Function Tests: No results for input(s): TSH, T4TOTAL, FREET4, T3FREE, THYROIDAB in the last 72 hours. Anemia Panel: No results for input(s): VITAMINB12, FOLATE, FERRITIN, TIBC, IRON, RETICCTPCT in the last 72 hours. Urine analysis:    Component Value Date/Time   COLORURINE COLORLESS (A) 01/21/2021 1638   APPEARANCEUR CLEAR 01/21/2021 1638   LABSPEC 1.028 01/21/2021 1638   PHURINE 5.0 01/21/2021 1638   GLUCOSEU >=500 (A) 01/21/2021 1638   HGBUR NEGATIVE 01/21/2021 1638   BILIRUBINUR NEGATIVE 01/21/2021 1638   KETONESUR 5 (A) 01/21/2021 1638   PROTEINUR NEGATIVE 01/21/2021 1638  NITRITE NEGATIVE 01/21/2021 1638   LEUKOCYTESUR NEGATIVE 01/21/2021 1638   Sepsis Labs: @LABRCNTIP (procalcitonin:4,lacticidven:4) )No results found for this or any previous visit (from the past 240 hour(s)).   Radiological Exams on Admission: DG Chest 2 View  Result Date: 01/21/2021 CLINICAL DATA:  Tachycardia EXAM: CHEST - 2 VIEW COMPARISON:  07/31/2020 FINDINGS: The heart size and mediastinal contours are within normal limits. Both lungs are clear. The visualized skeletal structures are unremarkable. IMPRESSION: No active cardiopulmonary disease. Electronically Signed   By: 08/02/2020 MD   On: 01/21/2021 17:22    EKG: Independently reviewed.  Sinus tachycardia no significant ST changes  Assessment/Plan Principal Problem:   DKA (diabetic ketoacidosis) (HCC) Active Problems:   AKI (acute kidney injury) (HCC)   Hyponatremia   Sinus tachycardia   Tobacco abuse   Troponin I above reference range     #1 DKA: Newly diagnosed diabetes.  Patient is blood sugar is getting better now with hydration and insulin.  Follow anion gap until it closes.  Once it closes transition to subcutaneous insulin.  Patient will be  managed for newly diagnosed diabetes thereafter.  #2 newly diagnosed diabetes: Patient will need diabetic education.  Will also need treatment.  May benefit from just oral hypoglycemics with the dietary counseling.  #3 AKI: Most likely prerenal due to dehydration.  Hydrate and monitor renal function.  #4 sinus tachycardia: Secondary to dehydration.  Continue to monitor  #5 elevated troponins: No chest pain.  Appears to be demand due to tachycardia.  Continue to monitor  #6 tobacco abuse: Counseling.  Offer nicotine patch   DVT prophylaxis: Lovenox Code Status: Full code Family Communication: No family at bedside Disposition Plan: Home Consults called: None Admission status: Inpatient  Severity of Illness: The appropriate patient status for this patient is INPATIENT. Inpatient status is judged to be reasonable and necessary in order to provide the required intensity of service to ensure the patient's safety. The patient's presenting symptoms, physical exam findings, and initial radiographic and laboratory data in the context of their chronic comorbidities is felt to place them at high risk for further clinical deterioration. Furthermore, it is not anticipated that the patient will be medically stable for discharge from the hospital within 2 midnights of admission. The following factors support the patient status of inpatient.   " The patient's presenting symptoms include shoulder pain and fatigue. " The worrisome physical exam findings include dry mucous membrane tachycardia. " The initial radiographic and laboratory data are worrisome because of blood sugar more than 1000 with a gap of 17. " The chronic co-morbidities include left shoulder pain.   * I certify that at the point of admission it is my clinical judgment that the patient will require inpatient hospital care spanning beyond 2 midnights from the point of admission due to high intensity of service, high risk for further  deterioration and high frequency of surveillance required.01/23/2021 MD [facility] Pager [phone]  If 7PM-7AM, please contact night-coverage [email or web address] Password [facility]  01/21/2021, 7:13 PM

## 2021-01-21 NOTE — ED Notes (Addendum)
MD wants LR to be completed then start potassium on that IV, the RT FA IV to start insulin at this time

## 2021-01-22 ENCOUNTER — Inpatient Hospital Stay (HOSPITAL_COMMUNITY): Payer: No Typology Code available for payment source

## 2021-01-22 ENCOUNTER — Encounter (HOSPITAL_COMMUNITY): Payer: Self-pay | Admitting: Internal Medicine

## 2021-01-22 DIAGNOSIS — R000 Tachycardia, unspecified: Secondary | ICD-10-CM

## 2021-01-22 DIAGNOSIS — R079 Chest pain, unspecified: Secondary | ICD-10-CM | POA: Diagnosis not present

## 2021-01-22 DIAGNOSIS — E1110 Type 2 diabetes mellitus with ketoacidosis without coma: Principal | ICD-10-CM

## 2021-01-22 DIAGNOSIS — R778 Other specified abnormalities of plasma proteins: Secondary | ICD-10-CM

## 2021-01-22 DIAGNOSIS — R7989 Other specified abnormal findings of blood chemistry: Secondary | ICD-10-CM

## 2021-01-22 DIAGNOSIS — I2602 Saddle embolus of pulmonary artery with acute cor pulmonale: Secondary | ICD-10-CM | POA: Diagnosis present

## 2021-01-22 DIAGNOSIS — R931 Abnormal findings on diagnostic imaging of heart and coronary circulation: Secondary | ICD-10-CM

## 2021-01-22 LAB — BASIC METABOLIC PANEL
Anion gap: 9 (ref 5–15)
Anion gap: 9 (ref 5–15)
BUN: 11 mg/dL (ref 8–23)
BUN: 12 mg/dL (ref 8–23)
CO2: 25 mmol/L (ref 22–32)
CO2: 27 mmol/L (ref 22–32)
Calcium: 8.9 mg/dL (ref 8.9–10.3)
Calcium: 8.8 mg/dL — ABNORMAL LOW (ref 8.9–10.3)
Chloride: 99 mmol/L (ref 98–111)
Chloride: 99 mmol/L (ref 98–111)
Creatinine, Ser: 0.83 mg/dL (ref 0.61–1.24)
Creatinine, Ser: 0.85 mg/dL (ref 0.61–1.24)
GFR, Estimated: >60 mL/min (ref >60)
GFR, Estimated: >60 mL/min (ref >60)
Glucose, Bld: 249 mg/dL — ABNORMAL HIGH (ref 70–99)
Glucose, Bld: 173 mg/dL — ABNORMAL HIGH (ref 70–99)
Potassium: 3.5 mmol/L (ref 3.5–5.1)
Potassium: 3.6 mmol/L (ref 3.5–5.1)
Sodium: 133 mmol/L — ABNORMAL LOW (ref 135–145)
Sodium: 135 mmol/L (ref 135–145)

## 2021-01-22 LAB — GLUCOSE, CAPILLARY
Glucose-Capillary: 160 mg/dL — ABNORMAL HIGH (ref 70–99)
Glucose-Capillary: 168 mg/dL — ABNORMAL HIGH (ref 70–99)
Glucose-Capillary: 170 mg/dL — ABNORMAL HIGH (ref 70–99)
Glucose-Capillary: 175 mg/dL — ABNORMAL HIGH (ref 70–99)
Glucose-Capillary: 182 mg/dL — ABNORMAL HIGH (ref 70–99)
Glucose-Capillary: 187 mg/dL — ABNORMAL HIGH (ref 70–99)
Glucose-Capillary: 229 mg/dL — ABNORMAL HIGH (ref 70–99)
Glucose-Capillary: 231 mg/dL — ABNORMAL HIGH (ref 70–99)
Glucose-Capillary: 246 mg/dL — ABNORMAL HIGH (ref 70–99)
Glucose-Capillary: 252 mg/dL — ABNORMAL HIGH (ref 70–99)
Glucose-Capillary: 306 mg/dL — ABNORMAL HIGH (ref 70–99)
Glucose-Capillary: 334 mg/dL — ABNORMAL HIGH (ref 70–99)

## 2021-01-22 LAB — MAGNESIUM: Magnesium: 2 mg/dL (ref 1.7–2.4)

## 2021-01-22 LAB — ECHOCARDIOGRAM COMPLETE
Area-P 1/2: 3.27 cm2
Calc EF: 27.9 %
Height: 68 in
S' Lateral: 3.9 cm
Single Plane A2C EF: 30.5 %
Single Plane A4C EF: 23.8 %
Weight: 2960 oz

## 2021-01-22 LAB — BETA-HYDROXYBUTYRIC ACID
Beta-Hydroxybutyric Acid: 0.26 mmol/L (ref 0.05–0.27)
Beta-Hydroxybutyric Acid: 0.13 mmol/L (ref 0.05–0.27)
Beta-Hydroxybutyric Acid: 0.64 mmol/L — ABNORMAL HIGH (ref 0.05–0.27)

## 2021-01-22 LAB — MRSA PCR SCREENING: MRSA by PCR: NEGATIVE

## 2021-01-22 LAB — SARS CORONAVIRUS 2 (TAT 6-24 HRS): SARS Coronavirus 2: NEGATIVE

## 2021-01-22 LAB — LIPID PANEL
Cholesterol: 195 mg/dL (ref 0–200)
HDL: 31 mg/dL — ABNORMAL LOW
LDL Cholesterol: 135 mg/dL — ABNORMAL HIGH (ref 0–99)
Total CHOL/HDL Ratio: 6.3 ratio
Triglycerides: 144 mg/dL
VLDL: 29 mg/dL (ref 0–40)

## 2021-01-22 LAB — TSH: TSH: 0.681 u[IU]/mL (ref 0.350–4.500)

## 2021-01-22 LAB — TROPONIN I (HIGH SENSITIVITY): Troponin I (High Sensitivity): 61 ng/L — ABNORMAL HIGH (ref <18)

## 2021-01-22 LAB — HIV ANTIBODY (ROUTINE TESTING W REFLEX): HIV Screen 4th Generation wRfx: NONREACTIVE

## 2021-01-22 LAB — BRAIN NATRIURETIC PEPTIDE: B Natriuretic Peptide: 366.3 pg/mL — ABNORMAL HIGH (ref 0.0–100.0)

## 2021-01-22 LAB — LACTIC ACID, PLASMA: Lactic Acid, Venous: 2.3 mmol/L (ref 0.5–1.9)

## 2021-01-22 MED ORDER — LIVING WELL WITH DIABETES BOOK
Freq: Once | Status: AC
  Filled 2021-01-22: qty 1

## 2021-01-22 MED ORDER — HEPARIN (PORCINE) 25000 UT/250ML-% IV SOLN
1500.0000 [IU]/h | INTRAVENOUS | Status: DC
  Administered 2021-01-22 – 2021-01-23 (×3): 1500 [IU]/h via INTRAVENOUS
  Filled 2021-01-22 (×3): qty 250

## 2021-01-22 MED ORDER — CHLORHEXIDINE GLUCONATE CLOTH 2 % EX PADS
6.0000 | MEDICATED_PAD | Freq: Every day | CUTANEOUS | Status: DC
  Administered 2021-01-22 – 2021-01-23 (×2): 6 via TOPICAL

## 2021-01-22 MED ORDER — MUPIROCIN 2 % EX OINT: 1.0000 "application " | TOPICAL_OINTMENT | Freq: Two times a day (BID) | CUTANEOUS | Status: DC

## 2021-01-22 MED ORDER — SODIUM CHLORIDE 0.9 % IV SOLN: INTRAVENOUS | Status: DC

## 2021-01-22 MED ORDER — INSULIN GLARGINE 100 UNIT/ML ~~LOC~~ SOLN
10.0000 [IU] | Freq: Every day | SUBCUTANEOUS | Status: DC
  Administered 2021-01-22: 10 [IU] via SUBCUTANEOUS
  Administered 2021-01-23: 6 [IU] via SUBCUTANEOUS
  Filled 2021-01-22 (×3): qty 0.1

## 2021-01-22 MED ORDER — INSULIN ASPART 100 UNIT/ML ~~LOC~~ SOLN
0.0000 [IU] | SUBCUTANEOUS | Status: DC
  Administered 2021-01-22: 3 [IU] via SUBCUTANEOUS
  Administered 2021-01-22: 5 [IU] via SUBCUTANEOUS
  Administered 2021-01-22: 3 [IU] via SUBCUTANEOUS
  Administered 2021-01-22 – 2021-01-23 (×3): 11 [IU] via SUBCUTANEOUS
  Administered 2021-01-23: 3 [IU] via SUBCUTANEOUS
  Administered 2021-01-23 – 2021-01-24 (×3): 2 [IU] via SUBCUTANEOUS
  Administered 2021-01-24: 5 [IU] via SUBCUTANEOUS

## 2021-01-22 MED ORDER — POTASSIUM CHLORIDE CRYS ER 20 MEQ PO TBCR
40.0000 meq | EXTENDED_RELEASE_TABLET | Freq: Once | ORAL | Status: AC
  Administered 2021-01-22: 40 meq via ORAL
  Filled 2021-01-22: qty 2

## 2021-01-22 MED ORDER — IOHEXOL 350 MG/ML SOLN
100.0000 mL | Freq: Once | INTRAVENOUS | Status: AC | PRN
  Administered 2021-01-22: 100 mL via INTRAVENOUS

## 2021-01-22 MED ORDER — INSULIN STARTER KIT- PEN NEEDLES (ENGLISH)
1.0000 | Freq: Once | Status: AC
  Administered 2021-01-22: 1
  Filled 2021-01-22: qty 1

## 2021-01-22 MED ORDER — HEPARIN BOLUS VIA INFUSION
6500.0000 [IU] | Freq: Once | INTRAVENOUS | Status: AC
  Administered 2021-01-22: 6500 [IU] via INTRAVENOUS
  Filled 2021-01-22: qty 6500
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
## 2021-01-22 NOTE — Progress Notes (Signed)
Inpatient Diabetes Program Recommendations  AACE/ADA: New Consensus Statement on Inpatient Glycemic Control (2015)  Target Ranges:  Prepandial:   less than 140 mg/dL      Peak postprandial:   less than 180 mg/dL (1-2 hours)      Critically ill patients:  140 - 180 mg/dL   Lab Results  Component Value Date   GLUCAP 229 (H) 01/22/2021    Review of Glycemic Control Results for GRAYSON, PFEFFERLE (MRN [id number]) as of 01/22/2021 14:08  Ref. Range 01/22/2021 06:32 01/22/2021 07:50 01/22/2021 11:48  Glucose-Capillary Latest Ref Range: 70 - 99 mg/dL 170 (H) 160 (H) 229 (H)   Diabetes history: DM 2-New diagnosis  Outpatient Diabetes medications: None Current orders for Inpatient glycemic control:  Lantus 10 units daily, Novolog moderate q 4 hours  Inpatient Diabetes Program Recommendations:     Spoke with pt about new diagnosis.  Discussed A1C results are  Pending and explained what an A1C is, basic pathophysiology of DM Type 2, basic home care, importance of checking CBGs and maintaining good CBG control to prevent long-term and short-term complications.  Reviewed signs and symptoms of hyperglycemia and hypoglycemia.  RNs to provide ongoing basic DM education at bedside with this patient.  Patient has educational booklet and insulin starter kit at bedside.   Educated patient on insulin pen use at home.  Reviewed contents of insulin flexpen starter kit.  Reviewed all steps if insulin pen including attachment of needle, 2-unit air shot, dialing up dose, giving injection, removing needle, disposal of sharps, storage of unused insulin, disposal of insulin etc.  Patient able to provide successful return demonstration.  Also reviewed troubleshooting with insulin pen.  MD to give patient Rxs for insulin pens and insulin pen needles. Teach back done.  At d/c will need Lantus solostar pen, insulin pen needles ([id number]), glucometer ([id number]) and metformin.  Needs close f/u at PCP.  Hopefully patient will be  able to d/c on basal insulin and metformin 500 mg bid at d/c.  He will f/u at [place].  Also asked RN to allow patient to self-administer insulin and prick fingers for glucose monitoring.  Will order dietician consult and DM videos as well.    Thanks  Adah Perl, RN, BC-ADM Inpatient Diabetes Coordinator Pager [phone] (8a-5p)

## 2021-01-22 NOTE — Progress Notes (Addendum)
PROGRESS NOTE    Manuel Long  EYC:[id number] DOB: January 02, 1961 DOA: 01/21/2021 PCP: [facility]    Chief Complaint  Patient presents with  . Fatigue    Brief Narrative:  Patient 60 year old gentleman history of chronic left shoulder pain presented to the ED with fatigue, polydipsia, polyphagia, polyuria x1 to 2 weeks with associated 5 pound weight loss over the past few months with feelings of weakness and debility. Patient seen in the ED initial labs with a sodium of 122, glucose of 1092, anion gap of 17, creatinine of 1.42, elevated troponin of 199, 188, 192, osmolality elevated at 331, beta hydroxybutyrate elevated at 2.76.  Urinalysis with greater than 500 glucose, nitrite negative, leukocytes negative, 5 ketones, specific gravity of 1.028.  EKG done with a sinus tachycardia.  pH 7.3 not quite acidotic but compensated with concomitant alkalosis from contraction.  Chest x-ray negative for any acute infiltrates. Patient was placed on the glucose stabilizer, IV fluids. Patient admitted for new diagnosis of diabetes and DKA.   Assessment & Plan:   Principal Problem:   DKA (diabetic ketoacidosis) (HCC) Active Problems:   AKI (acute kidney injury) (HCC)   Hyponatremia   Sinus tachycardia   Tobacco abuse   Troponin I above reference range   Elevated troponin   1  New onset diabetes mellitus/DKA -Patient admitted with symptoms of polyuria, polydipsia, polyphagia, generalized fatigue, 5 pound weight loss. -Admission labs with urinalysis with 5 ketones, >500 glucose, nitrite negative.  Initial labs with a glucose of 1092, sodium of 122, anion gap of 17.  Beta hydroxybutyrate elevated at 2.76.  Osmolality elevated at 331.  Troponins elevated but flattened. -Patient placed on the glucose stabilizer. -Blood glucose levels have improved. -Anion gap closed. -Check a hemoglobin A1c. -Patient has been transitioned to subcutaneous Lantus 10 units daily as well as SSI. -Continue  CBG every 4 hours for the next 24 hours. -Carb modified diet. -Consult with diabetic coordinator. -Follow.  2.  Elevated troponin - Patient presented with elevated troponin however although denies any chest pain or shortness of breath presented with new onset DKA and concerns for possible cardiac etiology. -Patient with a history of tobacco use. -Repeat EKG this morning. -Check a fasting lipid panel. -Check a 2D echo. -Consult with cardiology for further evaluation and management.  3.  Hyponatremia -Likely secondary to problem #1. -Resolved.  4.  Acute kidney injury -Likely secondary to a prerenal azotemia in the setting of dehydration. -Renal function improved with hydration. -Follow.  5.  Tobacco abuse -Tobacco cessation. -Nicotine patch offered.  6.  Sinus tachycardia -Likely secondary to dehydration and #1. -Improved.   DVT prophylaxis: Lovenox Code Status: Full Family Communication: Updated patient.  No family at bedside Disposition:   Status is: Inpatient    Dispo: The patient is from: Home              Anticipated d/c is to: Home              Patient currently being transitioned to subcutaneous insulin.  New onset diabetes.  Elevated troponin.  Not stable for discharge.   Difficult to place patient no       Consultants:   Cardiology pending  Procedures:   2D echo pending   Chest x-ray 01/21/2021  Antimicrobials:  None   Subjective: In bed currently getting a repeat EKG.  Denies any chest pain.  No shortness of breath.  States polydipsia, polyuria improving. Feels better than on admission.  Objective: Vitals:  01/22/21 0300 01/22/21 0400 01/22/21 0600 01/22/21 0800  BP: 106/73 97/72 93/65  127/79  Pulse: (!) 105 (!) 101 95 (!) 104  Resp: 19 19 17 14   Temp:    98 F (36.7 C)  TempSrc:    Oral  SpO2: 92% 91% 93% 93%  Weight:      Height:        Intake/Output Summary (Last 24 hours) at 01/22/2021 0943 Last data filed at 01/22/2021  0300 Gross per 24 hour  Intake 848.71 ml  Output 400 ml  Net 448.71 ml   Filed Weights   01/21/21 1611  Weight: 83.9 kg    Examination:  General exam: Appears calm and comfortable  Respiratory system: Clear to auscultation. Respiratory effort normal. Cardiovascular system: S1 & S2 heard, RRR. No JVD, murmurs, rubs, gallops or clicks. No pedal edema. Gastrointestinal system: Abdomen is nondistended, soft and nontender. No organomegaly or masses felt. Normal bowel sounds heard. Central nervous system: Alert and oriented. No focal neurological deficits. Extremities: Symmetric 5 x 5 power. Skin: No rashes, lesions or ulcers Psychiatry: Judgement and insight appear normal. Mood & affect appropriate.     Data Reviewed: I have personally reviewed following labs and imaging studies  CBC: Recent Labs  Lab 01/21/21 1623 01/21/21 2131  WBC 5.4 5.3  HGB 16.9 15.7  HCT 55.0* 46.3  MCV 89.3 82.1  PLT 195 179    Basic Metabolic Panel: Recent Labs  Lab 01/21/21 1623 01/21/21 1815 01/21/21 2131 01/22/21 0101 01/22/21 0445  NA 122* 129* 132* 133* 135  K 4.7 4.4 3.8 3.5 3.6  CL 81* 93* 98 99 99  CO2 24 20* 22 25 27   GLUCOSE 1,092* 570* 321* 249* 173*  BUN 14 12 10 11 12   CREATININE 1.42* 1.15 0.87 0.83 0.85  CALCIUM 9.4 9.1 8.9 8.9 8.8*  MG  --   --   --   --  2.0    GFR: Estimated Creatinine Clearance: 96.3 mL/min (by C-G formula based on SCr of 0.85 mg/dL).  Liver Function Tests: No results for input(s): AST, ALT, ALKPHOS, BILITOT, PROT, ALBUMIN in the last 168 hours.  CBG: Recent Labs  Lab 01/22/21 0339 01/22/21 0444 01/22/21 0542 01/22/21 0632 01/22/21 0750  GLUCAP 168* 182* 175* 170* 160*     Recent Results (from the past 240 hour(s))  MRSA PCR Screening     Status: None   Collection Time: 01/21/21  9:56 PM   Specimen: Nasopharyngeal  Result Value Ref Range Status   MRSA by PCR NEGATIVE NEGATIVE Final    Comment:        The GeneXpert MRSA Assay  (FDA approved for NASAL specimens only), is one component of a comprehensive MRSA colonization surveillance program. It is not intended to diagnose MRSA infection nor to guide or monitor treatment for MRSA infections. Performed at [facility], [address] [place], [place]          Radiology Studies: DG Chest 2 View  Result Date: 01/21/2021 CLINICAL DATA:  Tachycardia EXAM: CHEST - 2 VIEW COMPARISON:  07/31/2020 FINDINGS: The heart size and mediastinal contours are within normal limits. Both lungs are clear. The visualized skeletal structures are unremarkable. IMPRESSION: No active cardiopulmonary disease. Electronically Signed   By: [id number] MD   On: 01/21/2021 17:22        Scheduled Meds: . Chlorhexidine Gluconate Cloth  6 each Topical Daily  . enoxaparin (LOVENOX) injection  40 mg Subcutaneous Q24H  .  insulin aspart  0-15 Units Subcutaneous Q4H  . insulin glargine  10 Units Subcutaneous Daily   Continuous Infusions: . sodium chloride 125 mL/hr at 01/22/21 0904     LOS: 1 day    Time spent: 40 minutes    Ramiro Harvest, MD [facility]   To contact the attending provider between 7A-7P or the covering provider during after hours 7P-7A, please log into the web site [email or web address] and access using universal [place] [place] password for that web site. If you do not have the password, please call the hospital operator.  01/22/2021, 9:43 AM
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
## 2021-01-22 NOTE — Progress Notes (Signed)
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  LVEF of 25 to 30%.  Grade 1 diastolic dysfunction.  Right ventricular systolic function severely reduced with right ventricular size moderately to severely enlarged, mildly dilated right atrial size, concern for McConnell sign. CT angiogram chest ordered per cardiology to rule out PE. We will transfer patient to stepdown unit for closer monitoring. Appreciate cardiology input and recommendations.  No charge.

## 2021-01-22 NOTE — Progress Notes (Signed)
  Echocardiogram 2D Echocardiogram has been performed.  Janalyn Harder 01/22/2021, 2:38 PM

## 2021-01-22 NOTE — Progress Notes (Signed)
Was called by radiology, Dr Bradly Chris, with prelim results of CT angio, which showed extensive PE with RV Strain. Code PE activated.  Will start IV heparin, check LE dopplers. .  No charge

## 2021-01-22 NOTE — Progress Notes (Signed)
Critical lab lactic 2.3, [facility] paged 01/22/21 @ 2030
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
## 2021-01-22 NOTE — Progress Notes (Signed)
ANTICOAGULATION CONSULT NOTE  Pharmacy Consult for IV heparin Indication: pulmonary embolus  Allergies  Allergen Reactions  . Coconut Flavor Itching  . Bee Venom Rash    Patient Measurements: Height: 5\' 8"  (172.7 cm) Weight: 83.9 kg (185 lb) IBW/kg (Calculated) : 68.4 Heparin Dosing Weight: TBW  Vital Signs: Temp: 98.1 F (36.7 C) (04/18 1300) Temp Source: Oral (04/18 1300) BP: 119/86 (04/18 1300) Pulse Rate: 107 (04/18 1300)  Labs: Recent Labs    01/21/21 1623 01/21/21 1758 01/21/21 1815 01/21/21 2131 01/22/21 0101 01/22/21 0445  HGB 16.9  --   --  15.7  --   --   HCT 55.0*  --   --  46.3  --   --   PLT 195  --   --  179  --   --   CREATININE 1.42*  --  1.15 0.87 0.83 0.85  TROPONINIHS 199* 188* 192*  --   --   --     Estimated Creatinine Clearance: 96.3 mL/min (by C-G formula based on SCr of 0.85 mg/dL).   Medical History: Past Medical History:  Diagnosis Date  . Chronic shoulder pain   . Diabetes mellitus without complication (HCC)   . Rotator cuff injury, Left     Medications:  Medications Prior to Admission  Medication Sig Dispense Refill Last Dose  . cyclobenzaprine (FLEXERIL) 10 MG tablet Take 10 mg by mouth 3 (three) times daily as needed for muscle spasms.   01/20/2021 at Unknown time  . naproxen (NAPROSYN) 500 MG tablet Take 500 mg by mouth 2 (two) times daily as needed for moderate pain.   01/20/2021 at Unknown time  . sildenafil (VIAGRA) 100 MG tablet Take 50 mg by mouth as needed for erectile dysfunction.   unknown  . ibuprofen (ADVIL,MOTRIN) 600 MG tablet Take 1 tablet (600 mg total) by mouth every 6 (six) hours as needed. (Patient not taking: Reported on 01/21/2021) 30 tablet 0 Completed Course at Unknown time  . methocarbamol (ROBAXIN) 500 MG tablet Take 1 tablet (500 mg total) by mouth 2 (two) times daily. (Patient not taking: Reported on 01/21/2021) 20 tablet 0 Completed Course at Unknown time  . predniSONE (STERAPRED UNI-PAK 21 TAB) 10 MG (21)  TBPK tablet Take 6 tabs by mouth daily  for 2 days, then 5 tabs for 2 days, then 4 tabs for 2 days, then 3 tabs for 2 days, 2 tabs for 2 days, then 1 tab by mouth daily for 2 days (Patient not taking: Reported on 01/21/2021) 42 tablet 0 Completed Course at Unknown time   Scheduled:  . Chlorhexidine Gluconate Cloth  6 each Topical Daily  . heparin  6,500 Units Intravenous Once  . insulin aspart  0-15 Units Subcutaneous Q4H  . insulin glargine  10 Units Subcutaneous Daily   Assessment: 35 yoM admitted 4/17 for new onset DM with DKA; noted to also have elevated troponins. Today 2D echo appeared abnormal, prompting CTA, which revealed extensive PE w/ RV strain. Pharmacy consulted to dose IV heparin.   Baseline INR, aPTT: not done  Prior anticoagulation: none PTA; Lovenox 40 mg SQ daily inpatient, LD 4/17 ~2200  Significant events:  Today, 01/22/2021:  CBC: WNL  SCr WNL  No bleeding or infusion issues per nursing  Goal of Therapy: Heparin level 0.3-0.7 units/ml Monitor platelets by anticoagulation protocol: Yes  Plan:  Heparin 6500 units IV bolus x 1  Heparin 1500 units/hr IV infusion  Check heparin level 8 hrs after start  Daily CBC,  daily heparin level once stable  Monitor for signs of bleeding or thrombosis  Bernadene Person, PharmD, BCPS [phone] 01/22/2021, 6:28 PM

## 2021-01-23 ENCOUNTER — Other Ambulatory Visit: Payer: Self-pay

## 2021-01-23 ENCOUNTER — Inpatient Hospital Stay (HOSPITAL_COMMUNITY): Payer: No Typology Code available for payment source

## 2021-01-23 ENCOUNTER — Encounter (HOSPITAL_COMMUNITY): Payer: Self-pay | Admitting: Internal Medicine

## 2021-01-23 DIAGNOSIS — I2602 Saddle embolus of pulmonary artery with acute cor pulmonale: Secondary | ICD-10-CM | POA: Diagnosis not present

## 2021-01-23 DIAGNOSIS — I5022 Chronic systolic (congestive) heart failure: Secondary | ICD-10-CM

## 2021-01-23 DIAGNOSIS — I2699 Other pulmonary embolism without acute cor pulmonale: Secondary | ICD-10-CM

## 2021-01-23 HISTORY — PX: IR US GUIDE VASC ACCESS RIGHT: IMG2390

## 2021-01-23 HISTORY — PX: IR ANGIOGRAM PULMONARY BILATERAL SELECTIVE: IMG664

## 2021-01-23 HISTORY — PX: IR ANGIOGRAM SELECTIVE EACH ADDITIONAL VESSEL: IMG667

## 2021-01-23 HISTORY — PX: IR INFUSION THROMBOL ARTERIAL INITIAL (MS): IMG5376

## 2021-01-23 LAB — GLUCOSE, CAPILLARY
Glucose-Capillary: 116 mg/dL — ABNORMAL HIGH (ref 70–99)
Glucose-Capillary: 135 mg/dL — ABNORMAL HIGH (ref 70–99)
Glucose-Capillary: 140 mg/dL — ABNORMAL HIGH (ref 70–99)
Glucose-Capillary: 175 mg/dL — ABNORMAL HIGH (ref 70–99)
Glucose-Capillary: 223 mg/dL — ABNORMAL HIGH (ref 70–99)
Glucose-Capillary: 332 mg/dL — ABNORMAL HIGH (ref 70–99)

## 2021-01-23 LAB — BASIC METABOLIC PANEL
Anion gap: 8 (ref 5–15)
BUN: 14 mg/dL (ref 8–23)
CO2: 24 mmol/L (ref 22–32)
Calcium: 8.4 mg/dL — ABNORMAL LOW (ref 8.9–10.3)
Chloride: 103 mmol/L (ref 98–111)
Creatinine, Ser: 0.81 mg/dL (ref 0.61–1.24)
GFR, Estimated: >60 mL/min (ref >60)
Glucose, Bld: 131 mg/dL — ABNORMAL HIGH (ref 70–99)
Potassium: 3.4 mmol/L — ABNORMAL LOW (ref 3.5–5.1)
Sodium: 135 mmol/L (ref 135–145)

## 2021-01-23 LAB — CBC
HCT: 41.5 % (ref 39.0–52.0)
Hemoglobin: 13.7 g/dL (ref 13.0–17.0)
MCH: 27.4 pg (ref 26.0–34.0)
MCHC: 33 g/dL (ref 30.0–36.0)
MCV: 83 fL (ref 80.0–100.0)
Platelets: 172 10*3/uL (ref 150–400)
RBC: 5 MIL/uL (ref 4.22–5.81)
RDW: 13 % (ref 11.5–15.5)
WBC: 6.6 10*3/uL (ref 4.0–10.5)
nRBC: 0 % (ref 0.0–0.2)

## 2021-01-23 LAB — HEMOGLOBIN A1C
Hgb A1c MFr Bld: 13.7 % — ABNORMAL HIGH (ref 4.8–5.6)
Hgb A1c MFr Bld: 13.3 % — ABNORMAL HIGH (ref 4.8–5.6)
Mean Plasma Glucose: 346 mg/dL
Mean Plasma Glucose: 335 mg/dL

## 2021-01-23 LAB — HEPARIN LEVEL (UNFRACTIONATED)
Heparin Unfractionated: 0.58 IU/mL (ref 0.30–0.70)
Heparin Unfractionated: 0.55 IU/mL (ref 0.30–0.70)

## 2021-01-23 LAB — POCT ACTIVATED CLOTTING TIME
Activated Clotting Time: 172 seconds
Activated Clotting Time: 232 seconds
Activated Clotting Time: 0 seconds
Activated Clotting Time: 261 seconds

## 2021-01-23 MED ORDER — GLUCERNA SHAKE PO LIQD: 237.0000 mL | Freq: Three times a day (TID) | ORAL | Status: DC

## 2021-01-23 MED ORDER — FENTANYL CITRATE (PF) 100 MCG/2ML IJ SOLN
INTRAMUSCULAR | Status: AC
  Filled 2021-01-23: qty 2

## 2021-01-23 MED ORDER — PROSOURCE PLUS PO LIQD: 30.0000 mL | Freq: Two times a day (BID) | ORAL | Status: DC

## 2021-01-23 MED ORDER — ADULT MULTIVITAMIN W/MINERALS CH
1.0000 | ORAL_TABLET | Freq: Every day | ORAL | Status: DC
  Administered 2021-01-23 – 2021-01-24 (×2): 1 via ORAL
  Filled 2021-01-23 (×2): qty 1

## 2021-01-23 MED ORDER — POTASSIUM CHLORIDE CRYS ER 10 MEQ PO TBCR
10.0000 meq | EXTENDED_RELEASE_TABLET | Freq: Once | ORAL | Status: AC
  Administered 2021-01-23: 10 meq via ORAL
  Filled 2021-01-23: qty 1

## 2021-01-23 MED ORDER — HEPARIN SODIUM (PORCINE) 1000 UNIT/ML IJ SOLN
INTRAMUSCULAR | Status: AC
  Filled 2021-01-23: qty 1

## 2021-01-23 MED ORDER — MIDAZOLAM HCL 2 MG/2ML IJ SOLN
INTRAMUSCULAR | Status: DC | PRN
  Administered 2021-01-23: 1 mg via INTRAVENOUS

## 2021-01-23 MED ORDER — FENTANYL CITRATE (PF) 100 MCG/2ML IJ SOLN
INTRAMUSCULAR | Status: DC | PRN
  Administered 2021-01-23 (×3): 50 ug via INTRAVENOUS

## 2021-01-23 MED ORDER — MIDAZOLAM HCL 2 MG/2ML IJ SOLN
INTRAMUSCULAR | Status: AC
  Filled 2021-01-23: qty 2

## 2021-01-23 MED ORDER — LIDOCAINE-EPINEPHRINE 1 %-1:100000 IJ SOLN
INTRAMUSCULAR | Status: AC
  Filled 2021-01-23: qty 1

## 2021-01-23 MED ORDER — HEPARIN SODIUM (PORCINE) 1000 UNIT/ML IJ SOLN
INTRAMUSCULAR | Status: DC | PRN
  Administered 2021-01-23: 5000 [IU] via INTRAVENOUS
  Administered 2021-01-23: 3000 [IU] via INTRAVENOUS

## 2021-01-23 MED ORDER — SODIUM CHLORIDE 0.9 % IV SOLN: INTRAVENOUS | Status: DC | PRN

## 2021-01-23 MED ORDER — POTASSIUM CHLORIDE 10 MEQ/100ML IV SOLN
10.0000 meq | INTRAVENOUS | Status: AC
  Administered 2021-01-23 (×2): 10 meq via INTRAVENOUS
  Filled 2021-01-23 (×2): qty 100

## 2021-01-23 NOTE — Plan of Care (Signed)
  Problem: Nutrition: Goal: Adequate nutrition will be maintained Outcome: Progressing   Problem: Education: Goal: Ability to describe self-care measures that may prevent or decrease complications (Diabetes Survival Skills Education) will improve Outcome: Progressing

## 2021-01-23 NOTE — Sedation Documentation (Signed)
Repeat PAP 63/33 (44)

## 2021-01-23 NOTE — Progress Notes (Signed)
Inpatient Diabetes Program Recommendations  AACE/ADA: New Consensus Statement on Inpatient Glycemic Control   Target Ranges:  Prepandial:   less than 140 mg/dL      Peak postprandial:   less than 180 mg/dL (1-2 hours)      Critically ill patients:  140 - 180 mg/dL   Results for Manuel Long, Manuel Long (MRN [id number]) as of 01/23/2021 08:25  Ref. Range 01/22/2021 06:32 01/22/2021 07:50 01/22/2021 11:48 01/22/2021 17:53 01/22/2021 21:26 01/22/2021 23:45 01/23/2021 04:09 01/23/2021 07:31  Glucose-Capillary Latest Ref Range: 70 - 99 mg/dL 703 (H) 500 (H)  Novolog 3 units  Lantus 10 units 229 (H)  Novolog 5 units  187 (H)  Novolog 3 units  334 (H)  Novolog 11 units  306 (H)  Novolog 11 units  140 (H)  Novolog 2 units  116 (H)   Review of Glycemic Control  Diabetes history: No Outpatient Diabetes medications: NA Current orders for Inpatient glycemic control: Lantus 10 units daily, Novolog 0-15 units Q4H  Inpatient Diabetes Program Recommendations:    Insulin: Please consider increasing Lantus to 16 units daily (based on 81.6 kg x 0.2 units).  Thanks, Orlando Penner, RN, MSN, CDE Diabetes Coordinator Inpatient Diabetes Program [phone] (Team Pager from 8am to 5pm)

## 2021-01-23 NOTE — Sedation Documentation (Signed)
PA pressure 59/32 (40)
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
## 2021-01-23 NOTE — Procedures (Signed)
Interventional Radiology Procedure Note  Procedure:  1) Pulmonary angiogram 2) Bilateral pulmonary artery mechanical thrombectom  Findings: Please refer to procedural dictation for full description.  24 Fr ClotTriever via right common femoral vein.  Pre-closed with 2 Proglides.    Pulmonary artery pressures (mmHg): Pre: 59/32 (mean 40) Post: 63/33 (mean 44)  Complications: None immediate  Estimated Blood Loss: 100 mL  Recommendations: Flat bedrest for 1 hour (until 14:30). Continue heparin gtt for now. Please obtain lower extremity duplex today. IR will follow.   Marliss Coots, MD

## 2021-01-23 NOTE — Progress Notes (Signed)
Initial Nutrition Assessment  DOCUMENTATION CODES:   Not applicable  INTERVENTION:   Glucerna Shake po TID, each supplement provides 220 kcal and 10 grams of protein  59ml Prosource Plus po BID, each supplement provides 100 kcals and 15 grams of protein  MVI with minerals daily  Provided diabetes diet education material in discharge summary and will attempt to reeducate at followup   NUTRITION DIAGNOSIS:   Inadequate oral intake related to lethargy/confusion as evidenced by per patient/family report.  GOAL:   Patient will meet greater than or equal to 90% of their needs  MONITOR:   PO intake,Supplement acceptance,Skin,Weight trends,Labs,I & O's  REASON FOR ASSESSMENT:   Consult Diet education  ASSESSMENT:   Pt admitted with DKA and newly diagnosed DM. As pt being worked up for DKA, pt underwent echo followed by CT angiogram of the chest which showed large saddle PE with significant clot burden. PMH includes L shoulder pain.  Pt unavailable at time of RD visit. Per RN, pt down for pulmonary angiogram and bilateral pulmonary artery mechanical thrombectomy.  Pt presented to the ED c/o fatigue, polydipsia, polyphagia, and polyuria. Pt also reported a 5 lb weight loss over the last few months.   RD was consulted to provide diabetes diet education; however, pt unavailable at time of RD visit and education will not be appropriate until pt is out of ICU. Will attached education materials to discharge summary, place outpatient education consult, and attempt inpatient education at follow-up.   No PO intake documented.  No UOP documented x24 hours  Reviewed weight history, no significant weight changes noted.   Medications: SSI Q4H, 10 units lantus daily, klor-con x1 Labs reviewed. CBGs [phone]   NUTRITION - FOCUSED PHYSICAL EXAM: Unable to perform at this time. Will attempt at follow-up.   Diet Order:   Diet Order            Diet Carb Modified Fluid  consistency: Thin; Room service appropriate? Yes  Diet effective now                 EDUCATION NEEDS:   Not appropriate for education at this time  Skin:  Skin Assessment: Skin Integrity Issues: Skin Integrity Issues:: Other (Comment) Other: puncture R groin  Last BM:  4/18  Height:   Ht Readings from Last 1 Encounters:  01/22/21 5\' 8"  (1.727 m)    Weight:   Wt Readings from Last 1 Encounters:  01/22/21 81.6 kg    BMI:  Body mass index is 27.37 kg/m.  Estimated Nutritional Needs:   Kcal:  2000-2200  Protein:  100-110 grams  Fluid:  >2L/d    01/24/21, MS, RD, LDN RD pager number and weekend/on-call pager number located in Amion.

## 2021-01-23 NOTE — Progress Notes (Signed)
Smith MD notified of patient's DVT in left leg.

## 2021-01-23 NOTE — Progress Notes (Signed)
Received Pt from [facility]. A/Ox4. Pt walked from stretcher to bed with no distress. Pt oriented to room. Call bell and personal belongings within reach. Bed in low locked position. Will continue to monitor

## 2021-01-23 NOTE — Sedation Documentation (Signed)
proglide perclose suture for closure
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
## 2021-01-23 NOTE — Progress Notes (Signed)
ANTICOAGULATION CONSULT NOTE  Pharmacy Consult for IV heparin Indication: pulmonary embolus  Allergies  Allergen Reactions  . Coconut Flavor Itching  . Bee Venom Rash    Patient Measurements: Height: 5\' 8"  (172.7 cm) Weight: 81.6 kg (180 lb) IBW/kg (Calculated) : 68.4 Heparin Dosing Weight: TBW  Vital Signs: Temp: 98.1 F (36.7 C) (04/19 0725) Temp Source: Oral (04/19 0725) BP: 132/89 (04/19 1310) Pulse Rate: 81 (04/19 1310)  Labs: Recent Labs    01/21/21 1623 01/21/21 1758 01/21/21 1815 01/21/21 2131 01/22/21 0101 01/22/21 0445 01/22/21 1905 01/23/21 0328 01/23/21 0741 01/23/21 1007  HGB 16.9  --   --  15.7  --   --   --  13.7  --   --   HCT 55.0*  --   --  46.3  --   --   --  41.5  --   --   PLT 195  --   --  179  --   --   --  172  --   --   HEPARINUNFRC  --   --   --   --   --   --   --  0.58  --  0.55  CREATININE 1.42*  --  1.15 0.87 0.83 0.85  --   --  0.81  --   TROPONINIHS 199* 188* 192*  --   --   --  61*  --   --   --     Estimated Creatinine Clearance: 92.7 mL/min (by C-G formula based on SCr of 0.81 mg/dL).  Assessment: 66 yoM admitted 4/17 for new onset DM with DKA; noted to also have elevated troponins. Today 2D echo appeared abnormal, prompting CTA, which revealed extensive PE w/ RV strain. Pharmacy consulted to dose IV heparin.  Heparin level is therapeutic at 0.55, on 1500 units/hr. CBC stable, no s/sx of bleeding or infusion issues.   Goal of Therapy: Heparin level 0.3-0.7 units/ml Monitor platelets by anticoagulation protocol: Yes  Plan: Continue heparin 1500 units/hr IV infusion Monitor daily HL, CBC, and for s/sx of bleeding  F/u after IR procedure  5/17, PharmD, BCCCP Clinical Pharmacist  Phone: [phone] 01/23/2021 1:20 PM  Please check AMION for all [facility] Pharmacy phone numbers After 10:00 PM, call Main Pharmacy [phone]

## 2021-01-23 NOTE — Progress Notes (Signed)
ANTICOAGULATION CONSULT NOTE  Pharmacy Consult for IV heparin Indication: pulmonary embolus  Allergies  Allergen Reactions  . Coconut Flavor Itching  . Bee Venom Rash    Patient Measurements: Height: 5\' 8"  (172.7 cm) Weight: 81.6 kg (180 lb) IBW/kg (Calculated) : 68.4 Heparin Dosing Weight: TBW  Vital Signs: Temp: 97.8 F (36.6 C) (04/19 0400) Temp Source: Oral (04/19 0400) BP: 92/67 (04/19 0400) Pulse Rate: 100 (04/19 0400)  Labs: Recent Labs    01/21/21 1623 01/21/21 1758 01/21/21 1815 01/21/21 2131 01/22/21 0101 01/22/21 0445 01/22/21 1905 01/23/21 0328  HGB 16.9  --   --  15.7  --   --   --  13.7  HCT 55.0*  --   --  46.3  --   --   --  41.5  PLT 195  --   --  179  --   --   --  172  HEPARINUNFRC  --   --   --   --   --   --   --  0.58  CREATININE 1.42*  --  1.15 0.87 0.83 0.85  --   --   TROPONINIHS 199* 188* 192*  --   --   --  61*  --     Estimated Creatinine Clearance: 88.3 mL/min (by C-G formula based on SCr of 0.85 mg/dL).  Assessment: 47 yoM admitted 4/17 for new onset DM with DKA; noted to also have elevated troponins. Today 2D echo appeared abnormal, prompting CTA, which revealed extensive PE w/ RV strain. Pharmacy consulted to dose IV heparin.  Heparin level therapeutic (0.58) on gtt at 1500 units/hr. No bleeding noted.  Goal of Therapy: Heparin level 0.3-0.7 units/ml Monitor platelets by anticoagulation protocol: Yes  Plan: Continue heparin 1500 units/hr IV infusion F/u 6 hr confirmatory heparin level  5/17, PharmD, BCPS Please see amion for complete clinical pharmacist phone list 01/23/2021, 4:22 AM

## 2021-01-23 NOTE — Progress Notes (Addendum)
Progress Note  Patient Name: Manuel Long Date of Encounter: 01/23/2021  [facility] Cardiologist: Parke Poisson, MD   Subjective   Breathing ok, no CP Wants to get IR procedure done, he is hungry  Inpatient Medications    Scheduled Meds: . Chlorhexidine Gluconate Cloth  6 each Topical Daily  . insulin aspart  0-15 Units Subcutaneous Q4H  . insulin glargine  10 Units Subcutaneous Daily   Continuous Infusions: . heparin 1,500 Units/hr (01/23/21 0407)   PRN Meds: cyclobenzaprine, dextrose   Vital Signs    Vitals:   01/22/21 2243 01/22/21 2341 01/23/21 0400 01/23/21 0725  BP: (!) 121/97 101/68 92/67 111/75  Pulse: (!) 112 (!) 105 100 99  Resp: Temp: 97.8 F (36.6 C) 97.9 F (36.6 C) 97.8 F (36.6 C) 98.1 F (36.7 C)  TempSrc: Oral Axillary Oral Oral  SpO2: 97%  96% 94%  Weight: 81.6 kg     Height:  (1.727 m)       Intake/Output Summary (Last 24 hours) at 01/23/2021 0848 Last data filed at 01/22/2021 2200 Gross per 24 hour  Intake 412.79 ml  Output --  Net 412.79 ml   Last 3 Weights 01/22/2021 01/21/2021 01/18/2017  Weight (lbs) 180 lb 185 lb 166 lb 8 oz  Weight (kg) 81.647 kg 83.915 kg 75.524 kg      Telemetry    SR, ST - Personally Reviewed  ECG    None today - Personally Reviewed  Physical Exam   GEN: No acute distress.   Neck: No JVD Cardiac: RRR, no murmurs, rubs, or gallops.  Respiratory: few rales bases bilaterally. GI: Soft, nontender, non-distended  MS: No edema; No deformity. Neuro:  Nonfocal  Psych: Normal affect   Labs    High Sensitivity Troponin:   Recent Labs  Lab 01/21/21 1623 01/21/21 1758 01/21/21 1815 01/22/21 1905  TROPONINIHS 199* 188* 192* 61*      Chemistry Recent Labs  Lab 01/21/21 2131 01/22/21 0101 01/22/21 0445  NA 132* 133* 135  K 3.8 3.5 3.6  CL 98 99 99  CO2 GLUCOSE 321* 249* 173*  BUN CREATININE 0.87 0.83 0.85  CALCIUM 8.9 8.9 8.8*  GFRNONAA  >60 >60 >60  ANIONGAP Hematology Recent Labs  Lab 01/21/21 1623 01/21/21 2131 01/23/21 0328  WBC 5.4 5.3 6.6  RBC 6.16* 5.64 5.00  HGB 16.9 15.7 13.7  HCT 55.0* 46.3 41.5  MCV 89.3 82.1 83.0  MCH 27.4 27.8 27.4  MCHC 30.7 33.9 33.0  RDW 13.2 12.8 13.0  PLT 195 179 172    BNP Recent Labs  Lab 01/22/21 1905  BNP 366.3*    Lab Results  Component Value Date   TSH 0.681 01/22/2021   Lab Results  Component Value Date   CHOL 195 01/22/2021   HDL 31 (L) 01/22/2021   LDLCALC 135 (H) 01/22/2021   TRIG 144 01/22/2021   CHOLHDL 6.3 01/22/2021   Lab Results  Component Value Date   HGBA1C 13.3 (H) 01/22/2021    DDimer No results for input(s): DDIMER in the last 168 hours.   Radiology    DG Chest 2 View  Result Date: 01/21/2021 CLINICAL DATA:  Tachycardia EXAM: CHEST - 2 VIEW COMPARISON:  07/31/2020 FINDINGS: The heart size and mediastinal contours are within normal limits. Both lungs are clear. The visualized skeletal structures are unremarkable. IMPRESSION: No active cardiopulmonary  disease. Electronically Signed   By: Helyn Numbers MD   On: 01/21/2021 17:22   CT ANGIO CHEST PE W OR WO CONTRAST  Result Date: 01/22/2021 CLINICAL DATA:  Right ventricular dysfunction on echocardiogram. Rule out pulmonary embolus. EXAM: CT ANGIOGRAPHY CHEST WITH CONTRAST TECHNIQUE: Multidetector CT imaging of the chest was performed using the standard protocol during bolus administration of intravenous contrast. Multiplanar CT image reconstructions and MIPs were obtained to evaluate the vascular anatomy. CONTRAST:  OMNIPAQUE IOHEXOL 350 MG/ML SOLN COMPARISON:  None. FINDINGS: Cardiovascular: Examination is positive for large saddle embolus as well as extensive filling defects involving the lobar and segmental pulmonary arteries to both upper and lower lobes as well as the right middle lobe and lingula. There is evidence of right heart strain with RV to LV ratio of 1.4. Reflux of  contrast material from the right atrium into the IVC and hepatic veins noted. The heart size appears normal. No pericardial effusion. Aortic atherosclerosis. Coronary artery calcifications. Mediastinum/Nodes: No enlarged mediastinal, hilar, or axillary lymph nodes. Thyroid gland, trachea, and esophagus demonstrate no significant findings. Lungs/Pleura: No significant pleural fluid. Peripheral areas of subpleural ground-glass attenuation noted overlying the right middle lobe compatible with areas of pulmonary infarct. No airspace consolidation. Upper Abdomen: No acute findings within the imaged portions of the upper abdomen. Musculoskeletal: No chest wall abnormality. No acute or significant osseous findings. Review of the MIP images confirms the above findings. IMPRESSION: 1. Positive for acute PE with CT evidence of right heart strain (RV/LV Ratio = 1.4) consistent with at least submassive (intermediate risk) PE. The presence of right heart strain has been associated with an increased risk of morbidity and mortality. 2. Aortic Atherosclerosis (ICD10-I70.0). Coronary artery calcifications. Critical Value/emergent results were called by telephone at the time of interpretation on 01/22/2021 at 5:54 pm to provider Dr. Janee Morn, who verbally acknowledged these results. Electronically Signed   By: Signa Kell M.D.   On: 01/22/2021 17:55   ECHOCARDIOGRAM COMPLETE  Result Date: 01/22/2021    ECHOCARDIOGRAM REPORT   Patient Name:   Manuel Long [facility] Date of Exam: 01/22/2021 Medical Rec #:  [id number]          Height:       68.0 in Accession #:    [id number]         Weight:       185.0 lb Date of Birth:  Jul 10, 1961          BSA:          1.977 m Patient Age:    59 years           BP:           119/86 mmHg Patient Gender: M                  HR:           98 bpm. Exam Location:  Inpatient Procedure: 2D Echo, 3D Echo, Cardiac Doppler and Color Doppler Indications:    R07.9* Chest pain, unspecified  History:        Patient  has no prior history of Echocardiogram examinations.                 Abnormal ECG; Risk Factors:Current Smoker and Diabetes. Elevated                 troponin.  Sonographer:    Sheralyn Boatman RDCS Referring Phys: [id number] DANIEL V THOMPSON IMPRESSIONS  1. 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 Discussed with Cardiology consulting team.  2. Left ventricular ejection fraction, by estimation, is 25 to 30%. The left ventricle has severely decreased function. The left ventricle demonstrates regional wall motion abnormalities.         All anterior LV segements, the basal-to-mid anterolateral LV segments, and mid-to-apical inferolateral LV segments appear severely hypokinetic. There is septal with flattening with diastole concerning for RV volume overload. Left ventricular diastolic parameters are consistent with Grade I diastolic dysfunction (impaired relaxation).  3. Right ventricular systolic function is severely reduced. The right ventricular size is moderately-to-severely enlarged. Tricuspid regurgitation signal is inadequate for assessing PA pressure.  4. Right atrial size was mildly dilated.  5. The mitral valve is normal in structure. Trivial mitral valve regurgitation.  6. The aortic valve is tricuspid. Aortic valve regurgitation is not visualized. No aortic stenosis is present. Comparison(s): No prior Echocardiogram. FINDINGS  Left Ventricle: Left ventricular ejection fraction, by estimation, is 25 to 30%. The left ventricle has severely decreased function. The left ventricle demonstrates regional wall motion abnormalities. All anterior LV segements, the basal-to-mid anterolateral LV segments, and mid-to-apical inferolateral LV segments appear severely hypokinetic. The left ventricular internal cavity size was normal in size. There is no left ventricular hypertrophy. The interventricular septum is flattened in diastole  ('D' shaped left ventricle), consistent with right ventricular volume overload. Left ventricular diastolic parameters are consistent with Grade I diastolic dysfunction (impaired relaxation). Right Ventricle: The right ventricular size is moderately-to-severely enlarged. No increase in right ventricular wall thickness. Right ventricular systolic function is severely reduced. Tricuspid regurgitation signal is inadequate for assessing PA pressure. Left Atrium: Left atrial size was normal in size. Right Atrium: Right atrial size was mildly dilated. Pericardium: There is no evidence of pericardial effusion. Mitral Valve: The mitral valve is normal in structure. There is mild thickening of the mitral valve leaflet(s). There is mild calcification of the mitral valve leaflet(s). Trivial mitral valve regurgitation. Tricuspid Valve: The tricuspid valve is normal in structure. Tricuspid valve regurgitation is trivial. Aortic Valve: The aortic valve is tricuspid. Aortic valve regurgitation is not visualized. No aortic stenosis is present. Pulmonic Valve: The pulmonic valve was normal in structure. Pulmonic valve regurgitation is trivial. Aorta: The aortic root is normal in size and structure. IAS/Shunts: No atrial level shunt detected by color flow Doppler.  LEFT VENTRICLE PLAX 2D LVIDd:         4.70 cm     Diastology LVIDs:         3.90 cm     LV e' medial:    5.98 cm/s LV PW:         1.30 cm     LV E/e' medial:  5.0 LV IVS:        1.10 cm     LV e' lateral:   9.03 cm/s LVOT diam:     2.10 cm     LV E/e' lateral: 3.3 LV SV:         28 LV SV Index:   14 LVOT Area:     3.46 cm  LV Volumes (MOD) LV vol d, MOD A2C: 71.4 ml LV vol d, MOD A4C: 61.4 ml LV vol s, MOD A2C: 49.6 ml LV vol s, MOD A4C: 46.8 ml LV SV MOD A2C:     21.8 ml LV SV MOD A4C:     61.4 ml LV SV MOD BP:  18.6 ml RIGHT VENTRICLE            IVC RV S prime:     5.33 cm/s  IVC diam: 2.20 cm TAPSE (M-mode): 1.2 cm LEFT ATRIUM             Index      RIGHT ATRIUM            Index LA diam:        2.90 cm 1.47 cm/m RA Area:     19.40 cm LA Vol (A2C):   18.9 ml 9.56 ml/m RA Volume:   62.90 ml  31.81 ml/m LA Vol (A4C):   12.4 ml 6.27 ml/m LA Biplane Vol: 16.0 ml 8.09 ml/m  AORTIC VALVE LVOT Vmax:   78.90 cm/s LVOT Vmean:  60.400 cm/s LVOT VTI:    0.082 m  AORTA Ao Root diam: 2.90 cm Ao Asc diam:  3.50 cm MITRAL VALVE MV Area (PHT): 3.27 cm    SHUNTS MV Decel Time: 232 msec    Systemic VTI:  0.08 m MV E velocity: 29.80 cm/s  Systemic Diam: 2.10 cm MV A velocity: 57.95 cm/s MV E/A ratio:  0.51 Laurance Flatten MD Electronically signed by Laurance Flatten MD Signature Date/Time: 01/22/2021/3:34:04 PM    Final     Cardiac Studies   ECHO: 01/23/2020 1. 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  Discussed with Cardiology consulting team.  2. Left ventricular ejection fraction, by estimation, is 25 to 30%. The  left ventricle has severely decreased function. The left ventricle  demonstrates regional wall motion abnormalities.      All anterior LV segements, the basal-to-mid anterolateral LV segments,  and mid-to-apical inferolateral LV segments appear severely hypokinetic.  There is septal with flattening with diastole concerning for RV volume  overload. Left ventricular diastolic parameters are consistent with Grade I diastolic dysfunction (impaired relaxation).  3. Right ventricular systolic function is severely reduced. The right  ventricular size is moderately-to-severely enlarged. Tricuspid  regurgitation signal is inadequate for assessing PA pressure.  4. Right atrial size was mildly dilated.  5. The mitral valve is normal in structure. Trivial mitral valve  regurgitation.  6. The aortic valve is tricuspid. Aortic valve regurgitation is not  visualized. No aortic stenosis is present.   Comparison(s): No prior Echocardiogram.    Patient  Profile     60 y.o. male with a hx of chronic L shoulder pain, was admitted 04/17 with DKA, blood glucose > 1000, cards saw for elevated troponin and biventricular failure.  Assessment & Plan    1. RV dysfunction: - once this was seen, pt evaluated for PE >> large saddle embolus  2. Saddle PE - IR seeing, for thrombectomy today - per IM/IR/CCM  3. LVD - EF 25-30%, + WMA - No reports of CP - trop elevation more in line w/ PE than ACS - will need ischemic eval at some point, for now, med rx - after IR procedure, assess VS to see if can tolerate low-dose Coreg or Entresto        For questions or updates, please contact [facility] Please consult [email or web address] for contact info under        Signed, Theodore Demark, PA-C  01/23/2021, 8:48 AM    Patient seen and examined with Theodore Demark PA-C.  Agree as above, with the following exceptions and changes as noted below. Patient seen after thrombectomy, eating well  and no complaints. Gen: NAD, CV: RRR, no murmurs, Lungs: clear, Abd: soft, Extrem: Warm, well perfused, no edema, Neuro/Psych: alert and oriented x 3, normal mood and affect. All available labs, radiology testing, previous records reviewed. Mechanical thrombectomy for saddle PE likely provoked due to immobility.   Will consider HF therapy for reduced EF which may be secondary to metabolic stress though he warrants an ischemic evaluation soon. This can potentially occur at time of discharge or as outpatient, will reassess patient tomorrow and determine time frame.   Would consider close outpatient follow up echocardiogram (4-6 weeks) to assess RH.  Parke Poisson, MD 01/23/21 5:03 PM

## 2021-01-23 NOTE — Progress Notes (Signed)
Pt transferred to [facility] from [facility]. CSW completed [facility] notification: [phone]  [name] LCSW

## 2021-01-23 NOTE — Progress Notes (Signed)
Bilateral lower extremity venous duplex has been completed. Preliminary results can be found in CV Proc through chart review.  Results were given to the patient's nurse, Diannia Ruder.  01/23/21 3:39 PM Olen Cordial RVT
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
## 2021-01-24 ENCOUNTER — Other Ambulatory Visit (HOSPITAL_COMMUNITY): Payer: Self-pay

## 2021-01-24 ENCOUNTER — Encounter (HOSPITAL_COMMUNITY): Payer: Self-pay | Admitting: Internal Medicine

## 2021-01-24 LAB — GLUCOSE, CAPILLARY
Glucose-Capillary: 140 mg/dL — ABNORMAL HIGH (ref 70–99)
Glucose-Capillary: 276 mg/dL — ABNORMAL HIGH (ref 70–99)

## 2021-01-24 LAB — CBC
HCT: 36.9 % — ABNORMAL LOW (ref 39.0–52.0)
Hemoglobin: 12.2 g/dL — ABNORMAL LOW (ref 13.0–17.0)
MCH: 27.9 pg (ref 26.0–34.0)
MCHC: 33.1 g/dL (ref 30.0–36.0)
MCV: 84.4 fL (ref 80.0–100.0)
Platelets: 159 10*3/uL (ref 150–400)
RBC: 4.37 MIL/uL (ref 4.22–5.81)
RDW: 13.3 % (ref 11.5–15.5)
WBC: 6.3 10*3/uL (ref 4.0–10.5)
nRBC: 0 % (ref 0.0–0.2)

## 2021-01-24 LAB — COMPREHENSIVE METABOLIC PANEL
ALT: 22 U/L (ref 0–44)
AST: 19 U/L (ref 15–41)
Albumin: 2.5 g/dL — ABNORMAL LOW (ref 3.5–5.0)
Alkaline Phosphatase: 70 U/L (ref 38–126)
Anion gap: 4 — ABNORMAL LOW (ref 5–15)
BUN: 11 mg/dL (ref 8–23)
CO2: 26 mmol/L (ref 22–32)
Calcium: 8.2 mg/dL — ABNORMAL LOW (ref 8.9–10.3)
Chloride: 104 mmol/L (ref 98–111)
Creatinine, Ser: 0.75 mg/dL (ref 0.61–1.24)
GFR, Estimated: >60 mL/min (ref >60)
Glucose, Bld: 142 mg/dL — ABNORMAL HIGH (ref 70–99)
Potassium: 3.6 mmol/L (ref 3.5–5.1)
Sodium: 134 mmol/L — ABNORMAL LOW (ref 135–145)
Total Bilirubin: 0.2 mg/dL — ABNORMAL LOW (ref 0.3–1.2)
Total Protein: 5.1 g/dL — ABNORMAL LOW (ref 6.5–8.1)

## 2021-01-24 LAB — MAGNESIUM: Magnesium: 1.8 mg/dL (ref 1.7–2.4)

## 2021-01-24 LAB — HEPARIN LEVEL (UNFRACTIONATED): Heparin Unfractionated: 0.56 IU/mL (ref 0.30–0.70)

## 2021-01-24 MED ORDER — METOPROLOL SUCCINATE ER 25 MG PO TB24
12.5000 mg | ORAL_TABLET | Freq: Every day | ORAL | 0 refills | Status: AC
  Filled 2021-01-24: qty 15, 30d supply, fill #0

## 2021-01-24 MED ORDER — METOPROLOL SUCCINATE ER 25 MG PO TB24
12.5000 mg | ORAL_TABLET | Freq: Every day | ORAL | 0 refills | Status: DC
  Filled 2021-01-24: qty 30, 60d supply, fill #0

## 2021-01-24 MED ORDER — BLOOD GLUCOSE METER KIT
PACK | 0 refills | Status: DC
Start: 2021-01-24 — End: 2021-01-24

## 2021-01-24 MED ORDER — INSULIN GLARGINE 100 UNIT/ML ~~LOC~~ SOLN
15.0000 [IU] | Freq: Every day | SUBCUTANEOUS | Status: DC
  Administered 2021-01-24: 15 [IU] via SUBCUTANEOUS
  Filled 2021-01-24: qty 0.15

## 2021-01-24 MED ORDER — ATORVASTATIN CALCIUM 40 MG PO TABS
40.0000 mg | ORAL_TABLET | Freq: Every day | ORAL | 0 refills | Status: AC
  Filled 2021-01-24: qty 30, 30d supply, fill #0

## 2021-01-24 MED ORDER — INSULIN PEN NEEDLE 32G X 4 MM MISC: 1.0000 | 0 refills | Status: DC | PRN

## 2021-01-24 MED ORDER — ACCU-CHEK GUIDE W/DEVICE KIT
PACK | 0 refills | Status: AC
  Filled 2021-01-24 (×2): qty 1, 1d supply, fill #0

## 2021-01-24 MED ORDER — NOVOLOG FLEXPEN 100 UNIT/ML ~~LOC~~ SOPN
PEN_INJECTOR | SUBCUTANEOUS | 0 refills | Status: AC
  Filled 2021-01-24: qty 15, 30d supply, fill #0

## 2021-01-24 MED ORDER — INSULIN ASPART 100 UNIT/ML ~~LOC~~ SOLN: 4.0000 [IU] | Freq: Three times a day (TID) | SUBCUTANEOUS | Status: DC

## 2021-01-24 MED ORDER — METOPROLOL SUCCINATE ER 25 MG PO TB24
12.5000 mg | ORAL_TABLET | Freq: Every day | ORAL | Status: DC
  Administered 2021-01-24: 12.5 mg via ORAL
  Filled 2021-01-24: qty 1

## 2021-01-24 MED ORDER — APIXABAN 5 MG PO TABS: 5.0000 mg | ORAL_TABLET | Freq: Two times a day (BID) | ORAL | Status: DC

## 2021-01-24 MED ORDER — APIXABAN 5 MG PO TABS
10.0000 mg | ORAL_TABLET | Freq: Two times a day (BID) | ORAL | Status: DC
  Administered 2021-01-24: 10 mg via ORAL
  Filled 2021-01-24: qty 2

## 2021-01-24 MED ORDER — MAGNESIUM SULFATE 2 GM/50ML IV SOLN
2.0000 g | Freq: Once | INTRAVENOUS | Status: AC
  Administered 2021-01-24: 2 g via INTRAVENOUS
  Filled 2021-01-24: qty 50

## 2021-01-24 MED ORDER — ASPIRIN 81 MG PO CHEW: 81.0000 mg | CHEWABLE_TABLET | Freq: Every day | ORAL | 0 refills | Status: DC

## 2021-01-24 MED ORDER — INSULIN ASPART 100 UNIT/ML ~~LOC~~ SOLN: 0.0000 [IU] | Freq: Three times a day (TID) | SUBCUTANEOUS | Status: DC

## 2021-01-24 MED ORDER — ELIQUIS 5 MG PO TABS: ORAL_TABLET | ORAL | 1 refills | Status: DC

## 2021-01-24 MED ORDER — METFORMIN HCL 500 MG PO TABS
500.0000 mg | ORAL_TABLET | Freq: Two times a day (BID) | ORAL | 0 refills | Status: AC
  Filled 2021-01-24: qty 60, 30d supply, fill #0

## 2021-01-24 MED ORDER — ELIQUIS 5 MG PO TABS: ORAL_TABLET | ORAL | 1 refills | Status: AC

## 2021-01-24 MED ORDER — INSULIN PEN NEEDLE 32G X 4 MM MISC
1.0000 | 0 refills | Status: AC | PRN
  Filled 2021-01-24: qty 100, 15d supply, fill #0

## 2021-01-24 MED ORDER — INSULIN PEN NEEDLE 32G X 4 MM MISC
1.0000 | 0 refills | Status: DC | PRN
  Filled 2021-01-24: qty 200, 30d supply, fill #0

## 2021-01-24 MED ORDER — ACCU-CHEK SOFTCLIX LANCETS MISC
5 refills | Status: AC
  Filled 2021-01-24: qty 100, 25d supply, fill #0

## 2021-01-24 MED ORDER — INSULIN GLARGINE 100 UNIT/ML SOLOSTAR PEN
15.0000 [IU] | PEN_INJECTOR | Freq: Every day | SUBCUTANEOUS | 0 refills | Status: DC
  Filled 2021-01-24: qty 6, 30d supply, fill #0

## 2021-01-24 MED ORDER — INSULIN GLARGINE 100 UNIT/ML SOLOSTAR PEN: 15.0000 [IU] | PEN_INJECTOR | Freq: Every day | SUBCUTANEOUS | 0 refills | Status: DC

## 2021-01-24 MED ORDER — ATORVASTATIN CALCIUM 40 MG PO TABS
40.0000 mg | ORAL_TABLET | Freq: Every day | ORAL | Status: DC
  Administered 2021-01-24: 40 mg via ORAL
  Filled 2021-01-24: qty 1

## 2021-01-24 MED ORDER — ASPIRIN 81 MG PO CHEW
81.0000 mg | CHEWABLE_TABLET | Freq: Every day | ORAL | Status: DC
  Administered 2021-01-24: 81 mg via ORAL
  Filled 2021-01-24: qty 1

## 2021-01-24 MED ORDER — ASPIRIN 81 MG PO CHEW
81.0000 mg | CHEWABLE_TABLET | Freq: Every day | ORAL | 0 refills | Status: DC
  Filled 2021-01-24: qty 30, 30d supply, fill #0

## 2021-01-24 MED ORDER — POTASSIUM CHLORIDE CRYS ER 20 MEQ PO TBCR
40.0000 meq | EXTENDED_RELEASE_TABLET | Freq: Once | ORAL | Status: AC
  Administered 2021-01-24: 40 meq via ORAL
  Filled 2021-01-24: qty 2

## 2021-01-24 MED ORDER — METFORMIN HCL 500 MG PO TABS
500.0000 mg | ORAL_TABLET | Freq: Two times a day (BID) | ORAL | 0 refills | Status: DC
  Filled 2021-01-24: qty 60, 30d supply, fill #0

## 2021-01-24 MED ORDER — APIXABAN (ELIQUIS) VTE STARTER PACK (10MG AND 5MG)
ORAL_TABLET | ORAL | 0 refills | Status: AC
  Filled 2021-01-24 (×2): qty 74, 30d supply, fill #0

## 2021-01-24 MED ORDER — METFORMIN HCL 500 MG PO TABS: 500.0000 mg | ORAL_TABLET | Freq: Two times a day (BID) | ORAL | 0 refills | Status: DC

## 2021-01-24 MED ORDER — ACCU-CHEK GUIDE VI STRP
ORAL_STRIP | 12 refills | Status: AC
  Filled 2021-01-24: qty 100, 25d supply, fill #0

## 2021-01-24 MED ORDER — NOVOLOG FLEXPEN 100 UNIT/ML ~~LOC~~ SOPN: PEN_INJECTOR | SUBCUTANEOUS | 0 refills | Status: DC

## 2021-01-24 MED ORDER — BLOOD GLUCOSE METER KIT
PACK | 0 refills | Status: DC
  Filled 2021-01-24: qty 1, fill #0

## 2021-01-24 MED ORDER — ASPIRIN 81 MG PO CHEW
81.0000 mg | CHEWABLE_TABLET | Freq: Every day | ORAL | 0 refills | Status: AC
  Filled 2021-01-24: qty 30, 30d supply, fill #0

## 2021-01-24 MED ORDER — NOVOLOG FLEXPEN 100 UNIT/ML ~~LOC~~ SOPN
PEN_INJECTOR | SUBCUTANEOUS | 0 refills | Status: DC
  Filled 2021-01-24: qty 15, 30d supply, fill #0

## 2021-01-24 MED ORDER — ATORVASTATIN CALCIUM 40 MG PO TABS: 40.0000 mg | ORAL_TABLET | Freq: Every day | ORAL | 0 refills | Status: DC

## 2021-01-24 MED ORDER — ATORVASTATIN CALCIUM 40 MG PO TABS
40.0000 mg | ORAL_TABLET | Freq: Every day | ORAL | 0 refills | Status: DC
  Filled 2021-01-24: qty 30, 30d supply, fill #0

## 2021-01-24 MED ORDER — INSULIN GLARGINE 100 UNIT/ML SOLOSTAR PEN
15.0000 [IU] | PEN_INJECTOR | Freq: Every day | SUBCUTANEOUS | 0 refills | Status: AC
  Filled 2021-01-24 (×2): qty 6, 30d supply, fill #0

## 2021-01-24 MED ORDER — METOPROLOL SUCCINATE ER 25 MG PO TB24: 12.5000 mg | ORAL_TABLET | Freq: Every day | ORAL | 0 refills | Status: DC

## 2021-01-24 NOTE — Discharge Instructions (Signed)
Outpatient Diabetes Education  You have been referred to [facility]'s Nutrition and Diabetes Education Services for outpatient diabetes education. A referral has been sent and the office will contact you for an appointment. For additional questions or to schedule an appointment, call [phone].    Carbohydrate Counting For People With Diabetes  Foods with carbohydrates make your blood glucose level go up. Learning how to count carbohydrates can help you control your blood glucose levels. First, identify the foods you eat that contain carbohydrates. Then, using the Foods with Carbohydrates chart, determine about how much carbohydrates are in your meals and snacks. Make sure you are eating foods with fiber, protein, and healthy fat along with your carbohydrate foods. Foods with Carbohydrates The following table shows carbohydrate foods that have about 15 grams of carbohydrate each. Using measuring cups, spoons, or a food scale when you first begin learning about carbohydrate counting can help you learn about the portion sizes you typically eat. The following foods have 15 grams carbohydrate each:  Grains . 1 slice bread (1 ounce)  . 1 small tortilla (6-inch size)  .  large bagel (1 ounce)  . 1/3 cup pasta or rice (cooked)  .  hamburger or hot dog bun ( ounce)  .  cup cooked cereal  .  to  cup ready-to-eat cereal  . 2 taco shells (5-inch size) Fruit . 1 small fresh fruit ( to 1 cup)  .  medium banana  . 17 small grapes (3 ounces)  . 1 cup melon or berries  .  cup canned or frozen fruit  . 2 tablespoons dried fruit (blueberries, cherries, cranberries, raisins)  .  cup unsweetened fruit juice  Starchy Vegetables .  cup cooked beans, peas, corn, potatoes/sweet potatoes  .  large baked potato (3 ounces)  . 1 cup acorn or butternut squash  Snack Foods . 3 to 6 crackers  . 8 potato chips or 13 tortilla chips ( ounce to 1 ounce)  . 3 cups popped popcorn  Dairy . 3/4 cup (6  ounces) nonfat plain yogurt, or yogurt with sugar-free sweetener  . 1 cup milk  . 1 cup plain rice, soy, coconut or flavored almond milk Sweets and Desserts .  cup ice cream or frozen yogurt  . 1 tablespoon jam, jelly, pancake syrup, table sugar, or honey  . 2 tablespoons light pancake syrup  . 1 inch square of frosted cake or 2 inch square of unfrosted cake  . 2 small cookies (2/3 ounce each) or  large cookie  Sometimes you'll have to estimate carbohydrate amounts if you don't know the exact recipe. One cup of mixed foods like soups can have 1 to 2 carbohydrate servings, while some casseroles might have 2 or more servings of carbohydrate. Foods that have less than 20 calories in each serving can be counted as "free" foods. Count 1 cup raw vegetables, or  cup cooked non-starchy vegetables as "free" foods. If you eat 3 or more servings at one meal, then count them as 1 carbohydrate serving.  Foods without Carbohydrates  Not all foods contain carbohydrates. Meat, some dairy, fats, non-starchy vegetables, and many beverages don't contain carbohydrate. So when you count carbohydrates, you can generally exclude chicken, pork, beef, fish, seafood, eggs, tofu, cheese, butter, sour cream, avocado, nuts, seeds, olives, mayonnaise, water, black coffee, unsweetened tea, and zero-calorie drinks. Vegetables with no or low carbohydrate include green beans, cauliflower, tomatoes, and onions. How much carbohydrate should I eat at each meal?  Carbohydrate  counting can help you plan your meals and manage your weight. Following are some starting points for carbohydrate intake at each meal. Work with your registered dietitian nutritionist to find the best range that works for your blood glucose and weight.   To Lose Weight To Maintain Weight  Women 2 - 3 carb servings 3 - 4 carb servings  Men 3 - 4 carb servings 4 - 5 carb servings  Checking your blood glucose after meals will help you know if you need to adjust the  timing, type, or number of carbohydrate servings in your meal plan. Achieve and keep a healthy body weight by balancing your food intake and physical activity.  Tips How should I plan my meals?  Plan for half the food on your plate to include non-starchy vegetables, like salad greens, broccoli, or carrots. Try to eat 3 to 5 servings of non-starchy vegetables every day. Have a protein food at each meal. Protein foods include chicken, fish, meat, eggs, or beans (note that beans contain carbohydrate). These two food groups (non-starchy vegetables and proteins) are low in carbohydrate. If you fill up your plate with these foods, you will eat less carbohydrate but still fill up your stomach. Try to limit your carbohydrate portion to  of the plate.  What fats are healthiest to eat?  Diabetes increases risk for heart disease. To help protect your heart, eat more healthy fats, such as olive oil, nuts, and avocado. Eat less saturated fats like butter, cream, and high-fat meats, like bacon and sausage. Avoid trans fats, which are in all foods that list "partially hydrogenated oil" as an ingredient. What should I drink?  Choose drinks that are not sweetened with sugar. The healthiest choices are water, carbonated or seltzer waters, and tea and coffee without added sugars.  Sweet drinks will make your blood glucose go up very quickly. One serving of soda or energy drink is  cup. It is best to drink these beverages only if your blood glucose is low.  Artificially sweetened, or diet drinks, typically do not increase your blood glucose if they have zero calories in them. Read labels of beverages, as some diet drinks do have carbohydrate and will raise your blood glucose. Label Reading Tips Read Nutrition Facts labels to find out how many grams of carbohydrate are in a food you want to eat. Don't forget: sometimes serving sizes on the label aren't the same as how much food you are going to eat, so you may need to  calculate how much carbohydrate is in the food you are serving yourself.   Carbohydrate Counting for People with Diabetes Sample 1-Day Menu  Breakfast  cup yogurt, low fat, low sugar (1 carbohydrate serving)   cup cereal, ready-to-eat, unsweetened (1 carbohydrate serving)  1 cup strawberries (1 carbohydrate serving)   cup almonds ( carbohydrate serving)  Lunch 1, 5 ounce can chunk light tuna  2 ounces cheese, low fat cheddar  6 whole wheat crackers (1 carbohydrate serving)  1 small apple (1 carbohydrate servings)   cup carrots ( carbohydrate serving)   cup snap peas  1 cup 1% milk (1 carbohydrate serving)   Evening Meal Stir fry made with: 3 ounces chicken  1 cup brown rice (3 carbohydrate servings)   cup broccoli ( carbohydrate serving)   cup green beans   cup onions  1 tablespoon olive oil  2 tablespoons teriyaki sauce ( carbohydrate serving)  Evening Snack 1 extra small banana (1 carbohydrate serving)  1   tablespoon peanut butter   Carbohydrate Counting for People with Diabetes Vegan Sample 1-Day Menu  Breakfast 1 cup cooked oatmeal (2 carbohydrate servings)   cup blueberries (1 carbohydrate serving)  2 tablespoons flaxseeds  1 cup soymilk fortified with calcium and vitamin D  1 cup coffee  Lunch 2 slices whole wheat bread (2 carbohydrate servings)   cup baked tofu   cup lettuce  2 slices tomato  2 slices avocado   cup baby carrots ( carbohydrate serving)  1 orange (1 carbohydrate serving)  1 cup soymilk fortified with calcium and vitamin D   Evening Meal Burrito made with: 1 6-inch corn tortilla (1 carbohydrate serving)  1 cup refried vegetarian beans (2 carbohydrate servings)   cup chopped tomatoes   cup lettuce   cup salsa  1/3 cup brown rice (1 carbohydrate serving)  1 tablespoon olive oil for rice   cup zucchini   Evening Snack 6 small whole grain crackers (1 carbohydrate serving)  2 apricots ( carbohydrate serving)   cup unsalted peanuts  ( carbohydrate serving)    Carbohydrate Counting for People with Diabetes Vegetarian (Lacto-Ovo) Sample 1-Day Menu  Breakfast 1 cup cooked oatmeal (2 carbohydrate servings)   cup blueberries (1 carbohydrate serving)  2 tablespoons flaxseeds  1 egg  1 cup 1% milk (1 carbohydrate serving)  1 cup coffee  Lunch 2 slices whole wheat bread (2 carbohydrate servings)  2 ounces low-fat cheese   cup lettuce  2 slices tomato  2 slices avocado   cup baby carrots ( carbohydrate serving)  1 orange (1 carbohydrate serving)  1 cup unsweetened tea  Evening Meal Burrito made with: 1 6-inch corn tortilla (1 carbohydrate serving)   cup refried vegetarian beans (1 carbohydrate serving)   cup tomatoes   cup lettuce   cup salsa  1/3 cup brown rice (1 carbohydrate serving)  1 tablespoon olive oil for rice   cup zucchini  1 cup 1% milk (1 carbohydrate serving)  Evening Snack 6 small whole grain crackers (1 carbohydrate serving)  2 apricots ( carbohydrate serving)   cup unsalted peanuts ( carbohydrate serving)    Copyright 2020  Academy of Nutrition and Dietetics. All rights reserved.  Using Nutrition Labels: Carbohydrate  . Serving Size  . Look at the serving size. All the information on the label is based on this portion. Jolyne Loa Per Container  . The number of servings contained in the package. . Guidelines for Carbohydrate  . Look at the total grams of carbohydrate in the serving size.  . 1 carbohydrate choice = 15 grams of carbohydrate. Range of Carbohydrate Grams Per Choice  Carbohydrate Grams/Choice Carbohydrate Choices  6-10   11-20 1  21-25 1  26-35 2  36-40 2  41-50 3  51-55 3  56-65 4  66-70 4  71-80 5    Copyright 2020  Academy of Nutrition and Dietetics. All rights reserved.  ==================================================================== Information on my medicine - ELIQUIS (apixaban)  Why was Eliquis prescribed for you? Eliquis was  prescribed to treat blood clots that may have been found in the veins of your legs (deep vein thrombosis) or in your lungs (pulmonary embolism) and to reduce the risk of them occurring again.  What do You need to know about Eliquis ? The starting dose is 10 mg (two 5 mg tablets) taken TWICE daily for the FIRST SEVEN (7) DAYS, then on 4/27  the dose is reduced to ONE 5 mg tablet taken TWICE daily.  Eliquis  may be taken with or without food.   Try to take the dose about the same time in the morning and in the evening. If you have difficulty swallowing the tablet whole please discuss with your pharmacist how to take the medication safely.  Take Eliquis exactly as prescribed and DO NOT stop taking Eliquis without talking to the doctor who prescribed the medication.  Stopping may increase your risk of developing a new blood clot.  Refill your prescription before you run out.  After discharge, you should have regular check-up appointments with your healthcare provider that is prescribing your Eliquis.    What do you do if you miss a dose? If a dose of ELIQUIS is not taken at the scheduled time, take it as soon as possible on the same day and twice-daily administration should be resumed. The dose should not be doubled to make up for a missed dose.  Important Safety Information A possible side effect of Eliquis is bleeding. You should call your healthcare provider right away if you experience any of the following: ? Bleeding from an injury or your nose that does not stop. ? Unusual colored urine (red or dark brown) or unusual colored stools (red or black). ? Unusual bruising for unknown reasons. ? A serious fall or if you hit your head (even if there is no bleeding).  Some medicines may interact with Eliquis and might increase your risk of bleeding or clotting while on Eliquis. To help avoid this, consult your healthcare provider or pharmacist prior to using any new prescription or  non-prescription medications, including herbals, vitamins, non-steroidal anti-inflammatory drugs (NSAIDs) and supplements.  This website has more information on Eliquis (apixaban): http://[email or web address].com/eliquis/home
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
## 2021-01-24 NOTE — Progress Notes (Signed)
Progress Note  Patient Name: Manuel Long Date of Encounter: 01/24/2021  Primary Cardiologist: Parke Poisson, MD   Subjective   Feels well and eager to go home  Inpatient Medications    Scheduled Meds: . (feeding supplement) PROSource Plus  30 mL Oral BID BM  . Chlorhexidine Gluconate Cloth  6 each Topical Daily  . feeding supplement (GLUCERNA SHAKE)  237 mL Oral TID BM  . insulin aspart  0-15 Units Subcutaneous Q4H  . insulin glargine  10 Units Subcutaneous Daily  . multivitamin with minerals  1 tablet Oral Daily   Continuous Infusions: . sodium chloride Stopped (01/23/21 2204)  . heparin 1,500 Units/hr (01/24/21 0600)   PRN Meds: sodium chloride, cyclobenzaprine, dextrose, fentaNYL, heparin sodium (porcine), midazolam   Vital Signs    Vitals:   01/24/21 0353 01/24/21 0400 01/24/21 0500 01/24/21 0600  BP:  96/66 91/60 105/71  Pulse:  80 75 89  Resp:  16 14 16   Temp: 98.9 F (37.2 C)     TempSrc: Oral     SpO2:  95% 96% 96%  Weight:      Height:        Intake/Output Summary (Last 24 hours) at 01/24/2021 0816 Last data filed at 01/24/2021 0600 Gross per 24 hour  Intake 1049.5 ml  Output 1350 ml  Net -300.5 ml   Filed Weights   01/21/21 1611 01/22/21 2243  Weight: 83.9 kg 81.6 kg    Telemetry    SR - Personally Reviewed  ECG    No new- Personally Reviewed  Physical Exam   GEN: No acute distress.   Neck: No JVD Cardiac: regular rhythm, normal rate, no murmurs, rubs, or gallops.  Respiratory: Clear to auscultation bilaterally. GI: Soft, nontender, non-distended  MS: No edema; No deformity. Neuro:  Nonfocal  Psych: Normal affect   Labs    Chemistry Recent Labs  Lab 01/22/21 0445 01/23/21 0741 01/24/21 0249  NA 135 135 134*  K 3.6 3.4* 3.6  CL 99 103 104  CO2 27 24 26   GLUCOSE 173* 131* 142*  BUN 12 14 11   CREATININE 0.85 0.81 0.75  CALCIUM 8.8* 8.4* 8.2*  PROT  --   --  5.1*  ALBUMIN  --   --  2.5*  AST  --   --  19   ALT  --   --  22  ALKPHOS  --   --  70  BILITOT  --   --  0.2*  GFRNONAA >60 >60 >60  ANIONGAP 9 8 4*     Hematology Recent Labs  Lab 01/21/21 2131 01/23/21 0328 01/24/21 0249  WBC 5.3 6.6 6.3  RBC 5.64 5.00 4.37  HGB 15.7 13.7 12.2*  HCT 46.3 41.5 36.9*  MCV 82.1 83.0 84.4  MCH 27.8 27.4 27.9  MCHC 33.9 33.0 33.1  RDW 12.8 13.0 13.3  PLT 179 172 159    Cardiac EnzymesNo results for input(s): TROPONINI in the last 168 hours. No results for input(s): TROPIPOC in the last 168 hours.   BNP Recent Labs  Lab 01/22/21 1905  BNP 366.3*     DDimer No results for input(s): DDIMER in the last 168 hours.   Radiology    CT ANGIO CHEST PE W OR WO CONTRAST  Result Date: 01/22/2021 CLINICAL DATA:  Right ventricular dysfunction on echocardiogram. Rule out pulmonary embolus. EXAM: CT ANGIOGRAPHY CHEST WITH CONTRAST TECHNIQUE: Multidetector CT imaging of the chest was performed using the standard protocol during bolus  administration of intravenous contrast. Multiplanar CT image reconstructions and MIPs were obtained to evaluate the vascular anatomy. CONTRAST:  OMNIPAQUE IOHEXOL 350 MG/ML SOLN COMPARISON:  None. FINDINGS: Cardiovascular: Examination is positive for large saddle embolus as well as extensive filling defects involving the lobar and segmental pulmonary arteries to both upper and lower lobes as well as the right middle lobe and lingula. There is evidence of right heart strain with RV to LV ratio of 1.4. Reflux of contrast material from the right atrium into the IVC and hepatic veins noted. The heart size appears normal. No pericardial effusion. Aortic atherosclerosis. Coronary artery calcifications. Mediastinum/Nodes: No enlarged mediastinal, hilar, or axillary lymph nodes. Thyroid gland, trachea, and esophagus demonstrate no significant findings. Lungs/Pleura: No significant pleural fluid. Peripheral areas of subpleural ground-glass attenuation noted overlying the right  middle lobe compatible with areas of pulmonary infarct. No airspace consolidation. Upper Abdomen: No acute findings within the imaged portions of the upper abdomen. Musculoskeletal: No chest wall abnormality. No acute or significant osseous findings. Review of the MIP images confirms the above findings. IMPRESSION: 1. Positive for acute PE with CT evidence of right heart strain (RV/LV Ratio = 1.4) consistent with at least submassive (intermediate risk) PE. The presence of right heart strain has been associated with an increased risk of morbidity and mortality. 2. Aortic Atherosclerosis (ICD10-I70.0). Coronary artery calcifications. Critical Value/emergent results were called by telephone at the time of interpretation on 01/22/2021 at 5:54 pm to provider Dr. Janee Morn, who verbally acknowledged these results. Electronically Signed   By: Signa Kell M.D.   On: 01/22/2021 17:55   IR Angiogram Pulmonary Bilateral Selective  Result Date: 01/23/2021 INDICATION: 60 year old male with intermediate-high risk acute pulmonary embolism with saddle morphology. EXAM: 1. Ultrasound-guided access of the right common femoral vein 2. Catheterization of the pulmonary artery 3. Pulmonary arterial manometry 4. Pulmonary angiography 5. Selective catheterization of the bilateral lobar and segmental pulmonary arteries 6. Mechanical thrombectomy of the bilateral pulmonary arteries 7. Perclose closure of the right common femoral vein COMPARISON:  Chest CT from 01/22/2021 MEDICATIONS: 8000 units heparin, intravenous ANESTHESIA/SEDATION: Versed 1 mg IV; Fentanyl 150 mcg IV Moderate Sedation Time:  106 minutes The patient was continuously monitored during the procedure by the interventional radiology nurse under my direct supervision. FLUOROSCOPY TIME:  Fluoroscopy Time: 9 minutes 42 seconds (83 mGy). COMPLICATIONS: None immediate. TECHNIQUE: Informed written consent was obtained from the patient after a thorough discussion of the  procedural risks, benefits and alternatives. All questions were addressed. Maximal Sterile Barrier Technique was utilized including caps, mask, sterile gowns, sterile gloves, sterile drape, hand hygiene and skin antiseptic. A timeout was performed prior to the initiation of the procedure. The right groin was prepped and draped in standard fashion. Preprocedure ultrasound evaluation demonstrated patent, compressible right common femoral vein. The procedure was planned. Subdermal local anesthetic was administered at the planned needle entry site with 1% lidocaine. Approximately 1 cm skin nick was made and blunt subcutaneous dissection was performed with curved Kelly's. Under direct ultrasound visualization, the right common femoral vein was accessed with a 21 gauge micropuncture needle. Using the micropuncture set, a Rosen wire was directed to the inferior vena cava under fluoroscopic guidance. After serial dilation up to 10 [place], 2 ProGlide Perclose devices were deployed at the 10 o'clock and 2 o'clock positions of the common femoral vein. An 8 [place], 11 cm sheath was then placed. A pigtail catheter is in placed over the [facility] wire which was removed and exchanged for  a tip deflecting wire. Under constant fluoroscopic guidance, the pigtail catheter was directed into the main pulmonary artery. Pulmonary manometry was performed and was 59/30, mean 40 mm Hg. Next, pulmonary angiography was performed which demonstrated multiple bilateral main, lobar, and segmental pulmonary emboli. The pigtail catheter was exchanged for a 5 French C2 over the [place] wire which was then directed into the right inferior subsegmental branches. The Rosen wire was exchanged for a superstiff, short taper Amplatz wire. Next, serial dilation was performed and a 24 [place] Gore dry seal sheath was placed in the right common femoral artery. Through the sheath, the 24 [place] Inari ClotTriever mechanical thrombectomy device was directed to the right  lower lobar segmental branch. Aspiration thrombectomy was performed with multiple passes yielding acute appearing thrombus. The yielded samples were aspirated and fresh, filter blood was returned to the patient via the indwelling femoral sheath. Right pulmonary angiography was performed which demonstrated adequate clearance of previously visualized pulmonary emboli. The indwelling wire and thrombectomy device were then positioned in the left pulmonary artery. Aspiration thrombectomy was performed with multiple passes yielding acute appearing thrombus. Left pulmonary angiography was then performed which demonstrated adequate clearance of previously visualized pulmonary emboli. Repeat pulmonary manometry was performed through the indwelling aspiration catheter and was 63/33, mean 44 mm Hg. The catheter and sheath were removed and the Perclose sutures were approximated, tied, and cut. Hemostasis was achieved with brief, gentle manual compression. The right groin incision was then approximated and covered with Dermabond. The patient tolerated the procedure well with intra procedural decreased pulse from 100s to 80s. The patient was transferred to the floor in good condition. FINDINGS: Acute bilateral saddle pulmonary emboli and pulmonary hypertension. Technically successful mechanical thrombectomy of bilateral pulmonary emboli. IMPRESSION: 1. Acute bilateral saddle pulmonary emboli and pulmonary hypertension. 2. Technically successful mechanical thrombectomy of bilateral pulmonary emboli. Marliss Coots, MD [facility] Radiology Electronically Signed   By: Marliss Coots MD   On: 01/23/2021 15:41   IR Angiogram Selective Each Additional Vessel  Result Date: 01/23/2021 INDICATION: 60 year old male with intermediate-high risk acute pulmonary embolism with saddle morphology. EXAM: 1. Ultrasound-guided access of the right common femoral vein 2. Catheterization of the  pulmonary artery 3. Pulmonary arterial manometry 4. Pulmonary angiography 5. Selective catheterization of the bilateral lobar and segmental pulmonary arteries 6. Mechanical thrombectomy of the bilateral pulmonary arteries 7. Perclose closure of the right common femoral vein COMPARISON:  Chest CT from 01/22/2021 MEDICATIONS: 8000 units heparin, intravenous ANESTHESIA/SEDATION: Versed 1 mg IV; Fentanyl 150 mcg IV Moderate Sedation Time:  106 minutes The patient was continuously monitored during the procedure by the interventional radiology nurse under my direct supervision. FLUOROSCOPY TIME:  Fluoroscopy Time: 9 minutes 42 seconds (83 mGy). COMPLICATIONS: None immediate. TECHNIQUE: Informed written consent was obtained from the patient after a thorough discussion of the procedural risks, benefits and alternatives. All questions were addressed. Maximal Sterile Barrier Technique was utilized including caps, mask, sterile gowns, sterile gloves, sterile drape, hand hygiene and skin antiseptic. A timeout was performed prior to the initiation of the procedure. The right groin was prepped and draped in standard fashion. Preprocedure ultrasound evaluation demonstrated patent, compressible right common femoral vein. The procedure was planned. Subdermal local anesthetic was administered at the planned needle entry site with 1% lidocaine. Approximately 1 cm skin nick was made and blunt subcutaneous dissection was performed with curved Kelly's. Under direct ultrasound visualization, the right common femoral vein was accessed with a 21 gauge micropuncture  needle. Using the micropuncture set, a Rosen wire was directed to the inferior vena cava under fluoroscopic guidance. After serial dilation up to 10 [place], 2 ProGlide Perclose devices were deployed at the 10 o'clock and 2 o'clock positions of the common femoral vein. An 8 [place], 11 cm sheath was then placed. A pigtail catheter is in [facility] which was removed  and exchanged for a tip deflecting wire. Under constant fluoroscopic guidance, the pigtail catheter was directed into the main pulmonary artery. Pulmonary manometry was performed and was 59/30, mean 40 mm Hg. Next, pulmonary angiography was performed which demonstrated multiple bilateral main, lobar, and segmental pulmonary emboli. The pigtail catheter was exchanged for a 5 French C2 over the [place] wire which was then directed into the right inferior subsegmental branches. The Rosen wire was exchanged for a superstiff, short taper Amplatz wire. Next, serial dilation was performed and a 24 [place] Gore dry seal sheath was placed in the right common femoral artery. Through the sheath, the 24 [place] Inari ClotTriever mechanical thrombectomy device was directed to the right lower lobar segmental branch. Aspiration thrombectomy was performed with multiple passes yielding acute appearing thrombus. The yielded samples were aspirated and fresh, filter blood was returned to the patient via the indwelling femoral sheath. Right pulmonary angiography was performed which demonstrated adequate clearance of previously visualized pulmonary emboli. The indwelling wire and thrombectomy device were then positioned in the left pulmonary artery. Aspiration thrombectomy was performed with multiple passes yielding acute appearing thrombus. Left pulmonary angiography was then performed which demonstrated adequate clearance of previously visualized pulmonary emboli. Repeat pulmonary manometry was performed through the indwelling aspiration catheter and was 63/33, mean 44 mm Hg. The catheter and sheath were removed and the Perclose sutures were approximated, tied, and cut. Hemostasis was achieved with brief, gentle manual compression. The right groin incision was then approximated and covered with Dermabond. The patient tolerated the procedure well with intra procedural decreased pulse from 100s to 80s. The patient was transferred to the  floor in good condition. FINDINGS: Acute bilateral saddle pulmonary emboli and pulmonary hypertension. Technically successful mechanical thrombectomy of bilateral pulmonary emboli. IMPRESSION: 1. Acute bilateral saddle pulmonary emboli and pulmonary hypertension. 2. Technically successful mechanical thrombectomy of bilateral pulmonary emboli. Marliss Cootsylan Suttle, MD [facility] Electronically Signed   By: Marliss Cootsylan  Suttle MD   On: 01/23/2021 15:41   IR Angiogram Selective Each Additional Vessel  Result Date: 01/23/2021 INDICATION: 60 year old male with intermediate-high risk acute pulmonary embolism with saddle morphology. EXAM: 1. Ultrasound-guided access of the right common femoral vein 2. Catheterization of the pulmonary artery 3. Pulmonary arterial manometry 4. Pulmonary angiography 5. Selective catheterization of the bilateral lobar and segmental pulmonary arteries 6. Mechanical thrombectomy of the bilateral pulmonary arteries 7. Perclose closure of the right common femoral vein COMPARISON:  Chest CT from 01/22/2021 MEDICATIONS: 8000 units heparin, intravenous ANESTHESIA/SEDATION: Versed 1 mg IV; Fentanyl 150 mcg IV Moderate Sedation Time:  106 minutes The patient was continuously monitored during the procedure by the interventional radiology nurse under my direct supervision. FLUOROSCOPY TIME:  Fluoroscopy Time: 9 minutes 42 seconds (83 mGy). COMPLICATIONS: None immediate. TECHNIQUE: Informed written consent was obtained from the patient after a thorough discussion of the procedural risks, benefits and alternatives. All questions were addressed. Maximal Sterile Barrier Technique was utilized including caps, mask, sterile gowns, sterile gloves, sterile drape, hand hygiene and skin antiseptic. A timeout was performed prior to the initiation of the procedure. The  right groin was prepped and draped in standard fashion. Preprocedure ultrasound evaluation  demonstrated patent, compressible right common femoral vein. The procedure was planned. Subdermal local anesthetic was administered at the planned needle entry site with 1% lidocaine. Approximately 1 cm skin nick was made and blunt subcutaneous dissection was performed with curved Kelly's. Under direct ultrasound visualization, the right common femoral vein was accessed with a 21 gauge micropuncture needle. Using the micropuncture set, a Rosen wire was directed to the inferior vena cava under fluoroscopic guidance. After serial dilation up to 10 [place], 2 ProGlide Perclose devices were deployed at the 10 o'clock and 2 o'clock positions of the common femoral vein. An 8 [place], 11 cm sheath was then placed. A pigtail catheter is in placed over the [facility] wire which was removed and exchanged for a tip deflecting wire. Under constant fluoroscopic guidance, the pigtail catheter was directed into the main pulmonary artery. Pulmonary manometry was performed and was 59/30, mean 40 mm Hg. Next, pulmonary angiography was performed which demonstrated multiple bilateral main, lobar, and segmental pulmonary emboli. The pigtail catheter was exchanged for a 5 French C2 over the [place] wire which was then directed into the right inferior subsegmental branches. The Rosen wire was exchanged for a superstiff, short taper Amplatz wire. Next, serial dilation was performed and a 24 [place] Gore dry seal sheath was placed in the right common femoral artery. Through the sheath, the 24 [place] Inari ClotTriever mechanical thrombectomy device was directed to the right lower lobar segmental branch. Aspiration thrombectomy was performed with multiple passes yielding acute appearing thrombus. The yielded samples were aspirated and fresh, filter blood was returned to the patient via the indwelling femoral sheath. Right pulmonary angiography was performed which demonstrated adequate clearance of previously visualized pulmonary emboli. The indwelling  wire and thrombectomy device were then positioned in the left pulmonary artery. Aspiration thrombectomy was performed with multiple passes yielding acute appearing thrombus. Left pulmonary angiography was then performed which demonstrated adequate clearance of previously visualized pulmonary emboli. Repeat pulmonary manometry was performed through the indwelling aspiration catheter and was 63/33, mean 44 mm Hg. The catheter and sheath were removed and the Perclose sutures were approximated, tied, and cut. Hemostasis was achieved with brief, gentle manual compression. The right groin incision was then approximated and covered with Dermabond. The patient tolerated the procedure well with intra procedural decreased pulse from 100s to 80s. The patient was transferred to the floor in good condition. FINDINGS: Acute bilateral saddle pulmonary emboli and pulmonary hypertension. Technically successful mechanical thrombectomy of bilateral pulmonary emboli. IMPRESSION: 1. Acute bilateral saddle pulmonary emboli and pulmonary hypertension. 2. Technically successful mechanical thrombectomy of bilateral pulmonary emboli. Marliss Coots, MD [facility] Radiology Electronically Signed   By: Marliss Coots MD   On: 01/23/2021 15:41   IR US Guide Vasc Access Right  Result Date: 01/23/2021 INDICATION: 60 year old male with intermediate-high risk acute pulmonary embolism with saddle morphology. EXAM: 1. Ultrasound-guided access of the right common femoral vein 2. Catheterization of the pulmonary artery 3. Pulmonary arterial manometry 4. Pulmonary angiography 5. Selective catheterization of the bilateral lobar and segmental pulmonary arteries 6. Mechanical thrombectomy of the bilateral pulmonary arteries 7. Perclose closure of the right common femoral vein COMPARISON:  Chest CT from 01/22/2021 MEDICATIONS: 8000 units heparin, intravenous ANESTHESIA/SEDATION: Versed 1 mg IV; Fentanyl 150  mcg IV Moderate Sedation Time:  106 minutes The patient was continuously monitored during the procedure by the interventional radiology nurse under my direct supervision.  FLUOROSCOPY TIME:  Fluoroscopy Time: 9 minutes 42 seconds (83 mGy). COMPLICATIONS: None immediate. TECHNIQUE: Informed written consent was obtained from the patient after a thorough discussion of the procedural risks, benefits and alternatives. All questions were addressed. Maximal Sterile Barrier Technique was utilized including caps, mask, sterile gowns, sterile gloves, sterile drape, hand hygiene and skin antiseptic. A timeout was performed prior to the initiation of the procedure. The right groin was prepped and draped in standard fashion. Preprocedure ultrasound evaluation demonstrated patent, compressible right common femoral vein. The procedure was planned. Subdermal local anesthetic was administered at the planned needle entry site with 1% lidocaine. Approximately 1 cm skin nick was made and blunt subcutaneous dissection was performed with curved Kelly's. Under direct ultrasound visualization, the right common femoral vein was accessed with a 21 gauge micropuncture needle. Using the micropuncture set, a Rosen wire was directed to the inferior vena cava under fluoroscopic guidance. After serial dilation up to 10 [place], 2 ProGlide Perclose devices were deployed at the 10 o'clock and 2 o'clock positions of the common femoral vein. An 8 [place], 11 cm sheath was then placed. A pigtail catheter is in placed over the [facility] wire which was removed and exchanged for a tip deflecting wire. Under constant fluoroscopic guidance, the pigtail catheter was directed into the main pulmonary artery. Pulmonary manometry was performed and was 59/30, mean 40 mm Hg. Next, pulmonary angiography was performed which demonstrated multiple bilateral main, lobar, and segmental pulmonary emboli. The pigtail catheter was exchanged for a 5 French C2 over the [place] wire  which was then directed into the right inferior subsegmental branches. The Rosen wire was exchanged for a superstiff, short taper Amplatz wire. Next, serial dilation was performed and a 24 [place] Gore dry seal sheath was placed in the right common femoral artery. Through the sheath, the 24 [place] Inari ClotTriever mechanical thrombectomy device was directed to the right lower lobar segmental branch. Aspiration thrombectomy was performed with multiple passes yielding acute appearing thrombus. The yielded samples were aspirated and fresh, filter blood was returned to the patient via the indwelling femoral sheath. Right pulmonary angiography was performed which demonstrated adequate clearance of previously visualized pulmonary emboli. The indwelling wire and thrombectomy device were then positioned in the left pulmonary artery. Aspiration thrombectomy was performed with multiple passes yielding acute appearing thrombus. Left pulmonary angiography was then performed which demonstrated adequate clearance of previously visualized pulmonary emboli. Repeat pulmonary manometry was performed through the indwelling aspiration catheter and was 63/33, mean 44 mm Hg. The catheter and sheath were removed and the Perclose sutures were approximated, tied, and cut. Hemostasis was achieved with brief, gentle manual compression. The right groin incision was then approximated and covered with Dermabond. The patient tolerated the procedure well with intra procedural decreased pulse from 100s to 80s. The patient was transferred to the floor in good condition. FINDINGS: Acute bilateral saddle pulmonary emboli and pulmonary hypertension. Technically successful mechanical thrombectomy of bilateral pulmonary emboli. IMPRESSION: 1. Acute bilateral saddle pulmonary emboli and pulmonary hypertension. 2. Technically successful mechanical thrombectomy of bilateral pulmonary emboli. Marliss Coots, MD [facility]  [facility] Radiology Electronically Signed   By: Marliss Coots MD   On: 01/23/2021 15:41   ECHOCARDIOGRAM COMPLETE  Result Date: 01/22/2021    ECHOCARDIOGRAM REPORT   Patient Name:   AYMEN WIDRIG [facility] Date of Exam: 01/22/2021 Medical Rec #:  [id number]          Height:       68.0 in  Accession #:    [id number]         Weight:       185.0 lb Date of Birth:  13-Oct-1960          BSA:          1.977 m Patient Age:    59 years           BP:           119/86 mmHg Patient Gender: M                  HR:           98 bpm. Exam Location:  Inpatient Procedure: 2D Echo, 3D Echo, Cardiac Doppler and Color Doppler Indications:    R07.9* Chest pain, unspecified  History:        Patient has no prior history of Echocardiogram examinations.                 Abnormal ECG; Risk Factors:Current Smoker and Diabetes. Elevated                 troponin.  Sonographer:    Sheralyn Boatman RDCS Referring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

## 2021-01-24 NOTE — Discharge Summary (Addendum)
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 planned. Subdermal local anesthetic was administered at the planned needle entry site with 1% lidocaine. Approximately 1 cm skin nick was made and blunt subcutaneous dissection was performed with curved Kelly's. Under direct ultrasound visualization, the right common femoral vein was accessed with a 21 gauge micropuncture needle. Using the micropuncture set, a Rosen wire was directed to the inferior vena cava under fluoroscopic guidance. After serial dilation up to 10 [place], 2 ProGlide Perclose devices were deployed at the 10 o'clock and 2 o'clock positions of the common femoral vein. An 8 [place], 11 cm sheath was then placed. A pigtail catheter is in placed over the [facility] wire which was removed  and exchanged for a tip deflecting wire. Under constant fluoroscopic guidance, the pigtail catheter was directed into the main pulmonary artery. Pulmonary manometry was performed and was 59/30, mean 40 mm Hg. Next, pulmonary angiography was performed which demonstrated multiple bilateral main, lobar, and segmental pulmonary emboli. The pigtail catheter was exchanged for a 5 French C2 over the [place] wire which was then directed into the right inferior subsegmental branches. The Rosen wire was exchanged for a superstiff, short taper Amplatz wire. Next, serial dilation was performed and a 24 [place] Gore dry seal sheath was placed in the right common femoral artery. Through the sheath, the 24 [place] Inari ClotTriever mechanical thrombectomy device was directed to the right lower lobar segmental branch. Aspiration thrombectomy was performed with multiple passes yielding acute appearing thrombus. The yielded samples were aspirated and fresh, filter blood was returned to the patient via the indwelling femoral sheath. Right pulmonary angiography was performed which demonstrated adequate clearance of previously visualized pulmonary emboli. The indwelling wire and thrombectomy device were then positioned in the left pulmonary artery. Aspiration thrombectomy was performed with multiple passes yielding acute appearing thrombus. Left pulmonary angiography was then performed which demonstrated adequate clearance of previously visualized pulmonary emboli. Repeat pulmonary  manometry was performed through the indwelling aspiration catheter and was 63/33, mean 44 mm Hg. The catheter and sheath were removed and the Perclose sutures were approximated, tied, and cut. Hemostasis was achieved with brief, gentle manual compression. The right groin incision was then approximated and covered with Dermabond. The patient tolerated the procedure well with intra procedural decreased pulse from 100s to 80s. The patient was transferred to the  floor in good condition. FINDINGS: Acute bilateral saddle pulmonary emboli and pulmonary hypertension. Technically successful mechanical thrombectomy of bilateral pulmonary emboli. IMPRESSION: 1. Acute bilateral saddle pulmonary emboli and pulmonary hypertension. 2. Technically successful mechanical thrombectomy of bilateral pulmonary emboli. Ruthann Cancer, MD [facility] Radiology Electronically Signed   By: Ruthann Cancer MD   On: 01/23/2021 15:41   IR US Guide Vasc Access Right  Result Date: 01/23/2021 INDICATION: 60 year old male with intermediate-high risk acute pulmonary embolism with saddle morphology. EXAM: 1. Ultrasound-guided access of the right common femoral vein 2. Catheterization of the pulmonary artery 3. Pulmonary arterial manometry 4. Pulmonary angiography 5. Selective catheterization of the bilateral lobar and segmental pulmonary arteries 6. Mechanical thrombectomy of the bilateral pulmonary arteries 7. Perclose closure of the right common femoral vein COMPARISON:  Chest CT from 01/22/2021 MEDICATIONS: 8000 units heparin, intravenous ANESTHESIA/SEDATION: Versed 1 mg IV; Fentanyl 150 mcg IV Moderate Sedation Time:  106 minutes The patient was continuously monitored during the procedure by the interventional radiology nurse under my direct supervision. FLUOROSCOPY TIME:  Fluoroscopy Time: 9 minutes 42 seconds (83 mGy). COMPLICATIONS: None immediate. TECHNIQUE: Informed written consent was obtained from the patient after a thorough discussion of the procedural risks, benefits and alternatives. All questions were addressed. Maximal Sterile Barrier Technique was utilized including caps, mask, sterile gowns, sterile gloves, sterile drape, hand hygiene and skin antiseptic. A timeout was performed prior to the initiation of the procedure. The right groin was prepped and draped in standard fashion. Preprocedure ultrasound evaluation demonstrated patent,  compressible right common femoral vein. The procedure was planned. Subdermal local anesthetic was administered at the planned needle entry site with 1% lidocaine. Approximately 1 cm skin nick was made and blunt subcutaneous dissection was performed with curved Kelly's. Under direct ultrasound visualization, the right common femoral vein was accessed with a 21 gauge micropuncture needle. Using the micropuncture set, a Rosen wire was directed to the inferior vena cava under fluoroscopic guidance. After serial dilation up to 10 [place], 2 ProGlide Perclose devices were deployed at the 10 o'clock and 2 o'clock positions of the common femoral vein. An 8 [place], 11 cm sheath was then placed. A pigtail catheter is in placed over the [facility] wire which was removed and exchanged for a tip deflecting wire. Under constant fluoroscopic guidance, the pigtail catheter was directed into the main pulmonary artery. Pulmonary manometry was performed and was 59/30, mean 40 mm Hg. Next, pulmonary angiography was performed which demonstrated multiple bilateral main, lobar, and segmental pulmonary emboli. The pigtail catheter was exchanged for a 5 French C2 over the [place] wire which was then directed into the right inferior subsegmental branches. The Rosen wire was exchanged for a superstiff, short taper Amplatz wire. Next, serial dilation was performed and a 24 [place] Gore dry seal sheath was placed in the right common femoral artery. Through the sheath, the 24 [place] Inari ClotTriever mechanical thrombectomy device was directed to the right lower lobar segmental branch. Aspiration thrombectomy was performed with multiple passes yielding acute appearing thrombus. The yielded samples were aspirated and  fresh, filter blood was returned to the patient via the indwelling femoral sheath. Right pulmonary angiography was performed which demonstrated adequate clearance of previously visualized pulmonary emboli. The indwelling wire and thrombectomy  device were then positioned in the left pulmonary artery. Aspiration thrombectomy was performed with multiple passes yielding acute appearing thrombus. Left pulmonary angiography was then performed which demonstrated adequate clearance of previously visualized pulmonary emboli. Repeat pulmonary manometry was performed through the indwelling aspiration catheter and was 63/33, mean 44 mm Hg. The catheter and sheath were removed and the Perclose sutures were approximated, tied, and cut. Hemostasis was achieved with brief, gentle manual compression. The right groin incision was then approximated and covered with Dermabond. The patient tolerated the procedure well with intra procedural decreased pulse from 100s to 80s. The patient was transferred to the floor in good condition. FINDINGS: Acute bilateral saddle pulmonary emboli and pulmonary hypertension. Technically successful mechanical thrombectomy of bilateral pulmonary emboli. IMPRESSION: 1. Acute bilateral saddle pulmonary emboli and pulmonary hypertension. 2. Technically successful mechanical thrombectomy of bilateral pulmonary emboli. Ruthann Cancer, MD [facility] Radiology Electronically Signed   By: Ruthann Cancer MD   On: 01/23/2021 15:41   ECHOCARDIOGRAM COMPLETE  Result Date: 01/22/2021    ECHOCARDIOGRAM REPORT   Patient Name:   Manuel [facility] Date of Exam: 01/22/2021 Medical Rec #:  [id number]          Height:       68.0 in Accession #:    [id number]         Weight:       185.0 lb Date of Birth:  1961/05/30          BSA:          1.977 m Patient Age:    63 years           BP:           119/86 mmHg Patient Gender: M                  HR:           98 bpm. Exam Location:  Inpatient Procedure: 2D Echo, 3D Echo, Cardiac Doppler and Color Doppler Indications:    R07.9* Chest pain, unspecified  History:        Patient has no prior history of Echocardiogram examinations.                 Abnormal ECG; Risk  Factors:Current Smoker and Diabetes. Elevated                 troponin.  Sonographer:    Roseanna Rainbow RDCS Referring Phys: [place]  1. 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 Discussed with Cardiology consulting team.  2. Left ventricular ejection fraction, by estimation, is 25 to 30%. The left ventricle has severely decreased function. The left ventricle demonstrates regional wall motion abnormalities.         All anterior LV segements, the basal-to-mid anterolateral LV segments, and mid-to-apical inferolateral LV segments appear severely hypokinetic. There is septal with flattening with diastole concerning for RV volume overload. Left ventricular diastolic parameters are consistent with Grade I diastolic dysfunction (impaired relaxation).  3. Right ventricular systolic function is severely reduced. The right ventricular size is moderately-to-severely enlarged. Tricuspid regurgitation signal is inadequate for assessing PA pressure.  4. Right atrial size was mildly dilated.  5.  The mitral valve is normal in structure. Trivial mitral valve regurgitation.  6. The aortic valve is tricuspid. Aortic valve regurgitation is not visualized. No aortic stenosis is present. Comparison(s): No prior Echocardiogram. FINDINGS  Left Ventricle: Left ventricular ejection fraction, by estimation, is 25 to 30%. The left ventricle has severely decreased function. The left ventricle demonstrates regional wall motion abnormalities. All anterior LV segements, the basal-to-mid anterolateral LV segments, and mid-to-apical inferolateral LV segments appear severely hypokinetic. The left ventricular internal cavity size was normal in size. There is no left ventricular hypertrophy. The interventricular septum is flattened in diastole ('D' shaped left ventricle), consistent with right ventricular volume overload. Left  ventricular diastolic parameters are consistent with Grade I diastolic dysfunction (impaired relaxation). Right Ventricle: The right ventricular size is moderately-to-severely enlarged. No increase in right ventricular wall thickness. Right ventricular systolic function is severely reduced. Tricuspid regurgitation signal is inadequate for assessing PA pressure. Left Atrium: Left atrial size was normal in size. Right Atrium: Right atrial size was mildly dilated. Pericardium: There is no evidence of pericardial effusion. Mitral Valve: The mitral valve is normal in structure. There is mild thickening of the mitral valve leaflet(s). There is mild calcification of the mitral valve leaflet(s). Trivial mitral valve regurgitation. Tricuspid Valve: The tricuspid valve is normal in structure. Tricuspid valve regurgitation is trivial. Aortic Valve: The aortic valve is tricuspid. Aortic valve regurgitation is not visualized. No aortic stenosis is present. Pulmonic Valve: The pulmonic valve was normal in structure. Pulmonic valve regurgitation is trivial. Aorta: The aortic root is normal in size and structure. IAS/Shunts: No atrial level shunt detected by color flow Doppler.  LEFT VENTRICLE PLAX 2D LVIDd:         4.70 cm     Diastology LVIDs:         3.90 cm     LV e' medial:    5.98 cm/s LV PW:         1.30 cm     LV E/e' medial:  5.0 LV IVS:        1.10 cm     LV e' lateral:   9.03 cm/s LVOT diam:     2.10 cm     LV E/e' lateral: 3.3 LV SV:         28 LV SV Index:   14 LVOT Area:     3.46 cm  LV Volumes (MOD) LV vol d, MOD A2C: 71.4 ml LV vol d, MOD A4C: 61.4 ml LV vol s, MOD A2C: 49.6 ml LV vol s, MOD A4C: 46.8 ml LV SV MOD A2C:     21.8 ml LV SV MOD A4C:     61.4 ml LV SV MOD BP:      18.6 ml RIGHT VENTRICLE            IVC RV S prime:     5.33 cm/s  IVC diam: 2.20 cm TAPSE (M-mode): 1.2 cm LEFT ATRIUM             Index      RIGHT ATRIUM           Index LA diam:        2.90 cm 1.47 cm/m RA Area:     19.40 cm LA Vol (A2C):    18.9 ml 9.56 ml/m RA Volume:   62.90 ml  31.81 ml/m LA Vol (A4C):   12.4 ml 6.27 ml/m LA Biplane Vol: 16.0 ml 8.09 ml/m  AORTIC VALVE LVOT Vmax:   78.90  cm/s LVOT Vmean:  60.400 cm/s LVOT VTI:    0.082 m  AORTA Ao Root diam: 2.90 cm Ao Asc diam:  3.50 cm MITRAL VALVE MV Area (PHT): 3.27 cm    SHUNTS MV Decel Time: 232 msec    Systemic VTI:  0.08 m MV E velocity: 29.80 cm/s  Systemic Diam: 2.10 cm MV A velocity: 57.95 cm/s MV E/A ratio:  0.51 Gwyndolyn Kaufman MD Electronically signed by Gwyndolyn Kaufman MD Signature Date/Time: 01/22/2021/3:34:04 PM    Final    VAS [place] LOWER EXTREMITY VENOUS (DVT)  Result Date: 01/23/2021  Lower Venous DVT Study Indications: Pulmonary embolism.  Risk Factors: Confirmed PE. Comparison Study: No prior studies. Performing Technologist: Oliver Hum RVT  Examination Guidelines: A complete evaluation includes B-mode imaging, spectral Doppler, color Doppler, and power Doppler as needed of all accessible portions of each vessel. Bilateral testing is considered an integral part of a complete examination. Limited examinations for reoccurring indications may be performed as noted. The reflux portion of the exam is performed with the patient in reverse Trendelenburg.  +---------+---------------+---------+-----------+----------+--------------+ RIGHT    CompressibilityPhasicitySpontaneityPropertiesThrombus Aging +---------+---------------+---------+-----------+----------+--------------+ CFV      Full           Yes      Yes                                 +---------+---------------+---------+-----------+----------+--------------+ SFJ      Full                                                        +---------+---------------+---------+-----------+----------+--------------+ FV Prox  Full                                                        +---------+---------------+---------+-----------+----------+--------------+ FV Mid   Full                                                         +---------+---------------+---------+-----------+----------+--------------+ FV DistalFull                                                        +---------+---------------+---------+-----------+----------+--------------+ PFV      Full                                                        +---------+---------------+---------+-----------+----------+--------------+ POP      Full           Yes      Yes                                 +---------+---------------+---------+-----------+----------+--------------+  PTV      Full                                                        +---------+---------------+---------+-----------+----------+--------------+ PERO     Full                                                        +---------+---------------+---------+-----------+----------+--------------+   +---------+---------------+---------+-----------+----------+--------------+ LEFT     CompressibilityPhasicitySpontaneityPropertiesThrombus Aging +---------+---------------+---------+-----------+----------+--------------+ CFV      Full           Yes      Yes                                 +---------+---------------+---------+-----------+----------+--------------+ SFJ      Full                                                        +---------+---------------+---------+-----------+----------+--------------+ FV Prox  Full                                                        +---------+---------------+---------+-----------+----------+--------------+ FV Mid   Full                                                        +---------+---------------+---------+-----------+----------+--------------+ FV DistalFull                                                        +---------+---------------+---------+-----------+----------+--------------+ PFV      Full                                                         +---------+---------------+---------+-----------+----------+--------------+ POP      Partial        Yes      Yes                  Acute          +---------+---------------+---------+-----------+----------+--------------+ PTV      None                                         Acute          +---------+---------------+---------+-----------+----------+--------------+  PERO     Full                                                        +---------+---------------+---------+-----------+----------+--------------+ Gastroc  Partial                                      Acute          +---------+---------------+---------+-----------+----------+--------------+     Summary: RIGHT: - There is no evidence of deep vein thrombosis in the lower extremity.  - No cystic structure found in the popliteal fossa.  LEFT: - Findings consistent with acute deep vein thrombosis involving the left popliteal vein, left posterior tibial veins, and left gastrocnemius veins. - No cystic structure found in the popliteal fossa.  *See table(s) above for measurements and observations. Electronically signed by Curt Jews MD on 01/23/2021 at 4:56:55 PM.    Final    IR INFUSION THROMBOL ARTERIAL INITIAL (MS)  Result Date: 01/23/2021 INDICATION: 60 year old male with intermediate-high risk acute pulmonary embolism with saddle morphology. EXAM: 1. Ultrasound-guided access of the right common femoral vein 2. Catheterization of the pulmonary artery 3. Pulmonary arterial manometry 4. Pulmonary angiography 5. Selective catheterization of the bilateral lobar and segmental pulmonary arteries 6. Mechanical thrombectomy of the bilateral pulmonary arteries 7. Perclose closure of the right common femoral vein COMPARISON:  Chest CT from 01/22/2021 MEDICATIONS: 8000 units heparin, intravenous ANESTHESIA/SEDATION: Versed 1 mg IV; Fentanyl 150 mcg IV Moderate Sedation Time:  106 minutes The patient was continuously monitored during the  procedure by the interventional radiology nurse under my direct supervision. FLUOROSCOPY TIME:  Fluoroscopy Time: 9 minutes 42 seconds (83 mGy). COMPLICATIONS: None immediate. TECHNIQUE: Informed written consent was obtained from the patient after a thorough discussion of the procedural risks, benefits and alternatives. All questions were addressed. Maximal Sterile Barrier Technique was utilized including caps, mask, sterile gowns, sterile gloves, sterile drape, hand hygiene and skin antiseptic. A timeout was performed prior to the initiation of the procedure. The right groin was prepped and draped in standard fashion. Preprocedure ultrasound evaluation demonstrated patent, compressible right common femoral vein. The procedure was planned. Subdermal local anesthetic was administered at the planned needle entry site with 1% lidocaine. Approximately 1 cm skin nick was made and blunt subcutaneous dissection was performed with curved Kelly's. Under direct ultrasound visualization, the right common femoral vein was accessed with a 21 gauge micropuncture needle. Using the micropuncture set, a Rosen wire was directed to the inferior vena cava under fluoroscopic guidance. After serial dilation up to 10 [place], 2 ProGlide Perclose devices were deployed at the 10 o'clock and 2 o'clock positions of the common femoral vein. An 8 [place], 11 cm sheath was then placed. A pigtail catheter is in placed over the [facility] wire which was removed and exchanged for a tip deflecting wire. Under constant fluoroscopic guidance, the pigtail catheter was directed into the main pulmonary artery. Pulmonary manometry was performed and was 59/30, mean 40 mm Hg. Next, pulmonary angiography was performed which demonstrated multiple bilateral main, lobar, and segmental pulmonary emboli. The pigtail catheter was exchanged for a 5 French C2 over the [place] wire which was then directed into the right inferior subsegmental branches. The Rosen wire was  exchanged for  a superstiff, short taper Amplatz wire. Next, serial dilation was performed and a 24 [place] Gore dry seal sheath was placed in the right common femoral artery. Through the sheath, the 24 [place] In[place] ClotTriever mechanical thrombectomy device was directed to the right lower lobar segmental branch. Aspiration thrombectomy was performed with multiple passes yielding acute appearing thrombus. The yielded samples were aspirated and fresh, filter blood was returned to the patient via the indwelling femoral sheath. Right pulmonary angiography was performed which demonstrated adequate clearance of previously visualized pulmonary emboli. The indwelling wire and thrombectomy device were then positioned in the left pulmonary artery. Aspiration thrombectomy was performed with multiple passes yielding acute appearing thrombus. Left pulmonary angiography was then performed which demonstrated adequate clearance of previously visualized pulmonary emboli. Repeat pulmonary manometry was performed through the indwelling aspiration catheter and was 63/33, mean 44 mm Hg. The catheter and sheath were removed and the Perclose sutures were approximated, tied, and cut. Hemostasis was achieved with brief, gentle manual compression. The right groin incision was then approximated and covered with Dermabond. The patient tolerated the procedure well with intra procedural decreased pulse from 100s to 80s. The patient was transferred to the floor in good condition. FINDINGS: Acute bilateral saddle pulmonary emboli and pulmonary hypertension. Technically successful mechanical thrombectomy of bilateral pulmonary emboli. IMPRESSION: 1. Acute bilateral saddle pulmonary emboli and pulmonary hypertension. 2. Technically successful mechanical thrombectomy of bilateral pulmonary emboli. Ruthann Cancer, MD [facility] Radiology Electronically Signed   By: Ruthann Cancer MD   On: 01/23/2021 15:41    IR INFUSION THROMBOL ARTERIAL INITIAL (MS)  Result Date: 01/23/2021 INDICATION: 60 year old male with intermediate-high risk acute pulmonary embolism with saddle morphology. EXAM: 1. Ultrasound-guided access of the right common femoral vein 2. Catheterization of the pulmonary artery 3. Pulmonary arterial manometry 4. Pulmonary angiography 5. Selective catheterization of the bilateral lobar and segmental pulmonary arteries 6. Mechanical thrombectomy of the bilateral pulmonary arteries 7. Perclose closure of the right common femoral vein COMPARISON:  Chest CT from 01/22/2021 MEDICATIONS: 8000 units heparin, intravenous ANESTHESIA/SEDATION: Versed 1 mg IV; Fentanyl 150 mcg IV Moderate Sedation Time:  106 minutes The patient was continuously monitored during the procedure by the interventional radiology nurse under my direct supervision. FLUOROSCOPY TIME:  Fluoroscopy Time: 9 minutes 42 seconds (83 mGy). COMPLICATIONS: None immediate. TECHNIQUE: Informed written consent was obtained from the patient after a thorough discussion of the procedural risks, benefits and alternatives. All questions were addressed. Maximal Sterile Barrier Technique was utilized including caps, mask, sterile gowns, sterile gloves, sterile drape, hand hygiene and skin antiseptic. A timeout was performed prior to the initiation of the procedure. The right groin was prepped and draped in standard fashion. Preprocedure ultrasound evaluation demonstrated patent, compressible right common femoral vein. The procedure was planned. Subdermal local anesthetic was administered at the planned needle entry site with 1% lidocaine. Approximately 1 cm skin nick was made and blunt subcutaneous dissection was performed with curved Kelly's. Under direct ultrasound visualization, the right common femoral vein was accessed with a 21 gauge micropuncture needle. Using the micropuncture set, a Rosen wire was directed to the inferior vena cava under fluoroscopic  guidance. After serial dilation up to 10 [place], 2 ProGlide Perclose devices were deployed at the 10 o'clock and 2 o'clock positions of the common femoral vein. An 8 [place], 11 cm sheath was then placed. A pigtail catheter is in placed over the [facility] wire which was removed and exchanged for a tip deflecting wire. Under  constant fluoroscopic guidance, the pigtail catheter was directed into the main pulmonary artery. Pulmonary manometry was performed and was 59/30, mean 40 mm Hg. Next, pulmonary angiography was performed which demonstrated multiple bilateral main, lobar, and segmental pulmonary emboli. The pigtail catheter was exchanged for a 5 French C2 over the [place] wire which was then directed into the right inferior subsegmental branches. The Rosen wire was exchanged for a superstiff, short taper Amplatz wire. Next, serial dilation was performed and a 24 [place] Gore dry seal sheath was placed in the right common femoral artery. Through the sheath, the 24 [place] Inari ClotTriever mechanical thrombectomy device was directed to the right lower lobar segmental branch. Aspiration thrombectomy was performed with multiple passes yielding acute appearing thrombus. The yielded samples were aspirated and fresh, filter blood was returned to the patient via the indwelling femoral sheath. Right pulmonary angiography was performed which demonstrated adequate clearance of previously visualized pulmonary emboli. The indwelling wire and thrombectomy device were then positioned in the left pulmonary artery. Aspiration thrombectomy was performed with multiple passes yielding acute appearing thrombus. Left pulmonary angiography was then performed which demonstrated adequate clearance of previously visualized pulmonary emboli. Repeat pulmonary manometry was performed through the indwelling aspiration catheter and was 63/33, mean 44 mm Hg. The catheter and sheath were removed and the Perclose sutures were approximated, tied, and cut.  Hemostasis was achieved with brief, gentle manual compression. The right groin incision was then approximated and covered with Dermabond. The patient tolerated the procedure well with intra procedural decreased pulse from 100s to 80s. The patient was transferred to the floor in good condition. FINDINGS: Acute bilateral saddle pulmonary emboli and pulmonary hypertension. Technically successful mechanical thrombectomy of bilateral pulmonary emboli. IMPRESSION: 1. Acute bilateral saddle pulmonary emboli and pulmonary hypertension. 2. Technically successful mechanical thrombectomy of bilateral pulmonary emboli. Ruthann Cancer, MD [facility] Radiology Electronically Signed   By: Ruthann Cancer MD   On: 01/23/2021 15:41     TODAY-DAY OF DISCHARGE:  Subjective:   Manuel Long today has no headache,no chest abdominal pain,no new weakness tingling or numbness, feels much better wants to go home today.   Objective:   Blood pressure 124/88, pulse 92, temperature 98.9 F (37.2 C), temperature source Oral, resp. rate (!) 21, height _0  (1.727 m), weight 81.6 kg, SpO2 96 %.  Intake/Output Summary (Last 24 hours) at 01/24/2021 1145 Last data filed at 01/24/2021 1000 Gross per 24 hour  Intake 1363.99 ml  Output 1850 ml  Net -486.01 ml   Filed Weights   01/21/21 1611 01/22/21 2243  Weight: 83.9 kg 81.6 kg    Exam: Awake Alert, Oriented *3, No new F.N deficits, Normal affect Joaquin.AT,PERRAL Supple Neck,No JVD, No cervical lymphadenopathy appriciated.  Symmetrical Chest wall movement, Good air movement bilaterally, CTAB RRR,No Gallops,Rubs or new Murmurs, No Parasternal Heave +ve B.Sounds, Abd Soft, Non tender, No organomegaly appriciated, No rebound -guarding or rigidity. No Cyanosis, Clubbing or edema, No new Rash or bruise   PERTINENT RADIOLOGIC STUDIES: CT ANGIO CHEST PE W OR WO CONTRAST  Result Date: 01/22/2021 CLINICAL DATA:  Right ventricular  dysfunction on echocardiogram. Rule out pulmonary embolus. EXAM: CT ANGIOGRAPHY CHEST WITH CONTRAST TECHNIQUE: Multidetector CT imaging of the chest was performed using the standard protocol during bolus administration of intravenous contrast. Multiplanar CT image reconstructions and MIPs were obtained to evaluate the vascular anatomy. CONTRAST:  138m OMNIPAQUE IOHEXOL 350 MG/ML SOLN COMPARISON:  None. FINDINGS: Cardiovascular: Examination is positive for large  saddle embolus as well as extensive filling defects involving the lobar and segmental pulmonary arteries to both upper and lower lobes as well as the right middle lobe and lingula. There is evidence of right heart strain with RV to LV ratio of 1.4. Reflux of contrast material from the right atrium into the IVC and hepatic veins noted. The heart size appears normal. No pericardial effusion. Aortic atherosclerosis. Coronary artery calcifications. Mediastinum/Nodes: No enlarged mediastinal, hilar, or axillary lymph nodes. Thyroid gland, trachea, and esophagus demonstrate no significant findings. Lungs/Pleura: No significant pleural fluid. Peripheral areas of subpleural ground-glass attenuation noted overlying the right middle lobe compatible with areas of pulmonary infarct. No airspace consolidation. Upper Abdomen: No acute findings within the imaged portions of the upper abdomen. Musculoskeletal: No chest wall abnormality. No acute or significant osseous findings. Review of the MIP images confirms the above findings. IMPRESSION: 1. Positive for acute PE with CT evidence of right heart strain (RV/LV Ratio = 1.4) consistent with at least submassive (intermediate risk) PE. The presence of right heart strain has been associated with an increased risk of morbidity and mortality. 2. Aortic Atherosclerosis (ICD10-I70.0). Coronary artery calcifications. Critical Value/emergent results were called by telephone at the time of interpretation on 01/22/2021 at 5:54 pm to  provider Dr. Grandville Silos, who verbally acknowledged these results. Electronically Signed   By: Kerby Moors M.D.   On: 01/22/2021 17:55   IR Angiogram Pulmonary Bilateral Selective  Result Date: 01/23/2021 INDICATION: 60 year old male with intermediate-high risk acute pulmonary embolism with saddle morphology. EXAM: 1. Ultrasound-guided access of the right common femoral vein 2. Catheterization of the pulmonary artery 3. Pulmonary arterial manometry 4. Pulmonary angiography 5. Selective catheterization of the bilateral lobar and segmental pulmonary arteries 6. Mechanical thrombectomy of the bilateral pulmonary arteries 7. Perclose closure of the right common femoral vein COMPARISON:  Chest CT from 01/22/2021 MEDICATIONS: 8000 units heparin, intravenous ANESTHESIA/SEDATION: Versed 1 mg IV; Fentanyl 150 mcg IV Moderate Sedation Time:  106 minutes The patient was continuously monitored during the procedure by the interventional radiology nurse under my direct supervision. FLUOROSCOPY TIME:  Fluoroscopy Time: 9 minutes 42 seconds (83 mGy). COMPLICATIONS: None immediate. TECHNIQUE: Informed written consent was obtained from the patient after a thorough discussion of the procedural risks, benefits and alternatives. All questions were addressed. Maximal Sterile Barrier Technique was utilized including caps, mask, sterile gowns, sterile gloves, sterile drape, hand hygiene and skin antiseptic. A timeout was performed prior to the initiation of the procedure. The right groin was prepped and draped in standard fashion. Preprocedure ultrasound evaluation demonstrated patent, compressible right common femoral vein. The procedure was planned. Subdermal local anesthetic was administered at the planned needle entry site with 1% lidocaine. Approximately 1 cm skin nick was made and blunt subcutaneous dissection was performed with curved Kelly's. Under direct ultrasound visualization, the right common femoral vein was accessed with  a 21 gauge micropuncture needle. Using the micropuncture set, a Rosen wire was directed to the inferior vena cava under fluoroscopic guidance. After serial dilation up to 10 [place], 2 ProGlide Perclose devices were deployed at the 10 o'clock and 2 o'clock positions of the common femoral vein. An 8 [place], 11 cm sheath was then placed. A pigtail catheter is in placed over the [facility] wire which was removed and exchanged for a tip deflecting wire. Under constant fluoroscopic guidance, the pigtail catheter was directed into the main pulmonary artery. Pulmonary manometry was performed and was 59/30, mean 40 mm Hg. Next, pulmonary angiography was performed which  demonstrated multiple bilateral main, lobar, and segmental pulmonary emboli. The pigtail catheter was exchanged for a 5 French C2 over the [place] wire which was then directed into the right inferior subsegmental branches. The Rosen wire was exchanged for a superstiff, short taper Amplatz wire. Next, serial dilation was performed and a 24 [place] Gore dry seal sheath was placed in the right common femoral artery. Through the sheath, the 24 [place] Inari ClotTriever mechanical thrombectomy device was directed to the right lower lobar segmental branch. Aspiration thrombectomy was performed with multiple passes yielding acute appearing thrombus. The yielded samples were aspirated and fresh, filter blood was returned to the patient via the indwelling femoral sheath. Right pulmonary angiography was performed which demonstrated adequate clearance of previously visualized pulmonary emboli. The indwelling wire and thrombectomy device were then positioned in the left pulmonary artery. Aspiration thrombectomy was performed with multiple passes yielding acute appearing thrombus. Left pulmonary angiography was then performed which demonstrated adequate clearance of previously visualized pulmonary emboli. Repeat pulmonary manometry was performed through the indwelling aspiration  catheter and was 63/33, mean 44 mm Hg. The catheter and sheath were removed and the Perclose sutures were approximated, tied, and cut. Hemostasis was achieved with brief, gentle manual compression. The right groin incision was then approximated and covered with Dermabond. The patient tolerated the procedure well with intra procedural decreased pulse from 100s to 80s. The patient was transferred to the floor in good condition. FINDINGS: Acute bilateral saddle pulmonary emboli and pulmonary hypertension. Technically successful mechanical thrombectomy of bilateral pulmonary emboli. IMPRESSION: 1. Acute bilateral saddle pulmonary emboli and pulmonary hypertension. 2. Technically successful mechanical thrombectomy of bilateral pulmonary emboli. Ruthann Cancer, MD [facility] Radiology Electronically Signed   By: Ruthann Cancer MD   On: 01/23/2021 15:41   IR Angiogram Selective Each Additional Vessel  Result Date: 01/23/2021 INDICATION: 60 year old male with intermediate-high risk acute pulmonary embolism with saddle morphology. EXAM: 1. Ultrasound-guided access of the right common femoral vein 2. Catheterization of the pulmonary artery 3. Pulmonary arterial manometry 4. Pulmonary angiography 5. Selective catheterization of the bilateral lobar and segmental pulmonary arteries 6. Mechanical thrombectomy of the bilateral pulmonary arteries 7. Perclose closure of the right common femoral vein COMPARISON:  Chest CT from 01/22/2021 MEDICATIONS: 8000 units heparin, intravenous ANESTHESIA/SEDATION: Versed 1 mg IV; Fentanyl 150 mcg IV Moderate Sedation Time:  106 minutes The patient was continuously monitored during the procedure by the interventional radiology nurse under my direct supervision. FLUOROSCOPY TIME:  Fluoroscopy Time: 9 minutes 42 seconds (83 mGy). COMPLICATIONS: None immediate. TECHNIQUE: Informed written consent was obtained from the patient after a thorough  discussion of the procedural risks, benefits and alternatives. All questions were addressed. Maximal Sterile Barrier Technique was utilized including caps, mask, sterile gowns, sterile gloves, sterile drape, hand hygiene and skin antiseptic. A timeout was performed prior to the initiation of the procedure. The right groin was prepped and draped in standard fashion. Preprocedure ultrasound evaluation demonstrated patent, compressible right common femoral vein. The procedure was planned. Subdermal local anesthetic was administered at the planned needle entry site with 1% lidocaine. Approximately 1 cm skin nick was made and blunt subcutaneous dissection was performed with curved Kelly's. Under direct ultrasound visualization, the right common femoral vein was accessed with a 21 gauge micropuncture needle. Using the micropuncture set, a Rosen wire was directed to the inferior vena cava under fluoroscopic guidance. After serial dilation up to 10 [place], 2 ProGlide Perclose devices were deployed at the 10 o'clock  and 2 o'clock positions of the common femoral vein. An 8 [place], 11 cm sheath was then placed. A pigtail catheter is in placed over the [facility] wire which was removed and exchanged for a tip deflecting wire. Under constant fluoroscopic guidance, the pigtail catheter was directed into the main pulmonary artery. Pulmonary manometry was performed and was 59/30, mean 40 mm Hg. Next, pulmonary angiography was performed which demonstrated multiple bilateral main, lobar, and segmental pulmonary emboli. The pigtail catheter was exchanged for a 5 French C2 over the [place] wire which was then directed into the right inferior subsegmental branches. The Rosen wire was exchanged for a superstiff, short taper Amplatz wire. Next, serial dilation was performed and a 24 [place] Gore dry seal sheath was placed in the right common femoral artery. Through the sheath, the 24 [place] Inari ClotTriever mechanical thrombectomy device was  directed to the right lower lobar segmental branch. Aspiration thrombectomy was performed with multiple passes yielding acute appearing thrombus. The yielded samples were aspirated and fresh, filter blood was returned to the patient via the indwelling femoral sheath. Right pulmonary angiography was performed which demonstrated adequate clearance of previously visualized pulmonary emboli. The indwelling wire and thrombectomy device were then positioned in the left pulmonary artery. Aspiration thrombectomy was performed with multiple passes yielding acute appearing thrombus. Left pulmonary angiography was then performed which demonstrated adequate clearance of previously visualized pulmonary emboli. Repeat pulmonary manometry was performed through the indwelling aspiration catheter and was 63/33, mean 44 mm Hg. The catheter and sheath were removed and the Perclose sutures were approximated, tied, and cut. Hemostasis was achieved with brief, gentle manual compression. The right groin incision was then approximated and covered with Dermabond. The patient tolerated the procedure well with intra procedural decreased pulse from 100s to 80s. The patient was transferred to the floor in good condition. FINDINGS: Acute bilateral saddle pulmonary emboli and pulmonary hypertension. Technically successful mechanical thrombectomy of bilateral pulmonary emboli. IMPRESSION: 1. Acute bilateral saddle pulmonary emboli and pulmonary hypertension. 2. Technically successful mechanical thrombectomy of bilateral pulmonary emboli. Ruthann Cancer, MD [facility] Inter[facility] Radiology [facility] Radiology Electronically Signed   By: Ruthann Cancer MD   On: 01/23/2021 15:41   IR Angiogram Selective Each Additional Vessel  Result Date: 01/23/2021 INDICATION: 60 year old male with intermediate-high risk acute pulmonary embolism with saddle morphology. EXAM: 1. Ultrasound-guided access of the right common femoral vein 2.  Catheterization of the pulmonary artery 3. Pulmonary arterial manometry 4. Pulmonary angiography 5. Selective catheterization of the bilateral lobar and segmental pulmonary arteries 6. Mechanical thrombectomy of the bilateral pulmonary arteries 7. Perclose closure of the right common femoral vein COMPARISON:  Chest CT from 01/22/2021 MEDICATIONS: 8000 units heparin, intravenous ANESTHESIA/SEDATION: Versed 1 mg IV; Fentanyl 150 mcg IV Moderate Sedation Time:  106 minutes The patient was continuously monitored during the procedure by the interventional radiology nurse under my direct supervision. FLUOROSCOPY TIME:  Fluoroscopy Time: 9 minutes 42 seconds (83 mGy). COMPLICATIONS: None immediate. TECHNIQUE: Informed written consent was obtained from the patient after a thorough discussion of the procedural risks, benefits and alternatives. All questions were addressed. Maximal Sterile Barrier Technique was utilized including caps, mask, sterile gowns, sterile gloves, sterile drape, hand hygiene and skin antiseptic. A timeout was performed prior to the initiation of the procedure. The right groin was prepped and draped in standard fashion. Preprocedure ultrasound evaluation demonstrated patent, compressible right common femoral vein. The procedure was planned. Subdermal local anesthetic was administered at the planned needle entry site with  1% lidocaine. Approximately 1 cm skin nick was made and blunt subcutaneous dissection was performed with curved Kelly's. Under direct ultrasound visualization, the right common femoral vein was accessed with a 21 gauge micropuncture needle. Using the micropuncture set, a Rosen wire was directed to the inferior vena cava under fluoroscopic guidance. After serial dilation up to 10 [place], 2 ProGlide Perclose devices were deployed at the 10 o'clock and 2 o'clock positions of the common femoral vein. An 8 [place], 11 cm sheath was then placed. A pigtail catheter is in placed over the [facility]  wire which was removed and exchanged for a tip deflecting wire. Under constant fluoroscopic guidance, the pigtail catheter was directed into the main pulmonary artery. Pulmonary manometry was performed and was 59/30, mean 40 mm Hg. Next, pulmonary angiography was performed which demonstrated multiple bilateral main, lobar, and segmental pulmonary emboli. The pigtail catheter was exchanged for a 5 French C2 over the [place] wire which was then directed into the right inferior subsegmental branches. The Rosen wire was exchanged for a superstiff, short taper Amplatz wire. Next, serial dilation was performed and a 24 [place] Gore dry seal sheath was placed in the right common femoral artery. Through the sheath, the 24 [place] Inari ClotTriever mechanical thrombectomy device was directed to the right lower lobar segmental branch. Aspiration thrombectomy was performed with multiple passes yielding acute appearing thrombus. The yielded samples were aspirated and fresh, filter blood was returned to the patient via the indwelling femoral sheath. Right pulmonary angiography was performed which demonstrated adequate clearance of previously visualized pulmonary emboli. The indwelling wire and thrombectomy device were then positioned in the left pulmonary artery. Aspiration thrombectomy was performed with multiple passes yielding acute appearing thrombus. Left pulmonary angiography was then performed which demonstrated adequate clearance of previously visualized pulmonary emboli. Repeat pulmonary manometry was performed through the indwelling aspiration catheter and was 63/33, mean 44 mm Hg. The catheter and sheath were removed and the Perclose sutures were approximated, tied, and cut. Hemostasis was achieved with brief, gentle manual compression. The right groin incision was then approximated and covered with Dermabond. The patient tolerated the procedure well with intra procedural decreased pulse from 100s to 80s. The patient was  transferred to the floor in good condition. FINDINGS: Acute bilateral saddle pulmonary emboli and pulmonary hypertension. Technically successful mechanical thrombectomy of bilateral pulmonary emboli. IMPRESSION: 1. Acute bilateral saddle pulmonary emboli and pulmonary hypertension. 2. Technically successful mechanical thrombectomy of bilateral pulmonary emboli. Ruthann Cancer, MD [facility] and Interventional Radiology Specialists [facility] Radiology Electronically Signed   By: Ruthann Cancer MD   On: 01/23/2021 15:41   IR US Guide Vasc Access Right  Result Date: 01/23/2021 INDICATION: 60 year old male with intermediate-high risk acute pulmonary embolism with saddle morphology. EXAM: 1. Ultrasound-guided access of the right common femoral vein 2. Catheterization of the pulmonary artery 3. Pulmonary arterial manometry 4. Pulmonary angiography 5. Selective catheterization of the bilateral lobar and segmental pulmonary arteries 6. Mechanical thrombectomy of the bilateral pulmonary arteries 7. Perclose closure of the right common femoral vein COMPARISON:  Chest CT from 01/22/2021 MEDICATIONS: 8000 units heparin, intravenous ANESTHESIA/SEDATION: Versed 1 mg IV; Fentanyl 150 mcg IV Moderate Sedation Time:  106 minutes The patient was continuously monitored during the procedure by the interventional radiology nurse under my direct supervision. FLUOROSCOPY TIME:  Fluoroscopy Time: 9 minutes 42 seconds (83 mGy). COMPLICATIONS: None immediate. TECHNIQUE: Informed written consent was obtained from the patient after a thorough discussion of the procedural risks, benefits and alternatives. All questions  were addressed. Maximal Sterile Barrier Technique was utilized including caps, mask, sterile gowns, sterile gloves, sterile drape, hand hygiene and skin antiseptic. A timeout was performed prior to the initiation of the procedure. The right groin was prepped and draped in standard fashion. Preprocedure ultrasound evaluation  demonstrated patent, compressible right common femoral vein. The procedure was planned. Subdermal local anesthetic was administered at the planned needle entry site with 1% lidocaine. Approximately 1 cm skin nick was made and blunt subcutaneous dissection was performed with curved Kelly's. Under direct ultrasound visualization, the right common femoral vein was accessed with a 21 gauge micropuncture needle. Using the micropuncture set, a Rosen wire was directed to the inferior vena cava under fluoroscopic guidance. After serial dilation up to 10 [place], 2 ProGlide Perclose devices were deployed at the 10 o'clock and 2 o'clock positions of the common femoral vein. An 8 [place], 11 cm sheath was then placed. A pigtail catheter is in placed over the [facility] wire which was removed and exchanged for a tip deflecting wire. Under constant fluoroscopic guidance, the pigtail catheter was directed into the main pulmonary artery. Pulmonary manometry was performed and was 59/30, mean 40 mm Hg. Next, pulmonary angiography was performed which demonstrated multiple bilateral main, lobar, and segmental pulmonary emboli. The pigtail catheter was exchanged for a 5 French C2 over the [place] wire which was then directed into the right inferior subsegmental branches. The Rosen wire was exchanged for a superstiff, short taper Amplatz wire. Next, serial dilation was performed and a 24 [place] Gore dry seal sheath was placed in the right common femoral artery. Through the sheath, the 24 [place] Inari ClotTriever mechanical thrombectomy device was directed to the right lower lobar segmental branch. Aspiration thrombectomy was performed with multiple passes yielding acute appearing thrombus. The yielded samples were aspirated and fresh, filter blood was returned to the patient via the indwelling femoral sheath. Right pulmonary angiography was performed which demonstrated adequate clearance of previously visualized pulmonary emboli. The indwelling  wire and thrombectomy device were then positioned in the left pulmonary artery. Aspiration thrombectomy was performed with multiple passes yielding acute appearing thrombus. Left pulmonary angiography was then performed which demonstrated adequate clearance of previously visualized pulmonary emboli. Repeat pulmonary manometry was performed through the indwelling aspiration catheter and was 63/33, mean 44 mm Hg. The catheter and sheath were removed and the Perclose sutures were approximated, tied, and cut. Hemostasis was achieved with brief, gentle manual compression. The right groin incision was then approximated and covered with Dermabond. The patient tolerated the procedure well with intra procedural decreased pulse from 100s to 80s. The patient was transferred to the floor in good condition. FINDINGS: Acute bilateral saddle pulmonary emboli and pulmonary hypertension. Technically successful mechanical thrombectomy of bilateral pulmonary emboli. IMPRESSION: 1. Acute bilateral saddle pulmonary emboli and pulmonary hypertension. 2. Technically successful mechanical thrombectomy of bilateral pulmonary emboli. Ruthann Cancer, MD [facility] Electronically Signed   By: Ruthann Cancer MD   On: 01/23/2021 15:41   ECHOCARDIOGRAM COMPLETE  Result Date: 01/22/2021    ECHOCARDIOGRAM REPORT   Patient Name:   Manuel Long [facility] Date of Exam: 01/22/2021 Medical Rec #:  [id number]          Height:       68.0 in Accession #:    [id number]         Weight:       185.0 lb Date of Birth:  10-14-1960  BSA:          1.977 m Patient Age:    88 years           BP:           119/86 mmHg Patient Gender: M                  HR:           98 bpm. Exam Location:  Inpatient Procedure: 2D Echo, 3D Echo, Cardiac Doppler and Color Doppler Indications:    R07.9* Chest pain, unspecified  History:        Patient has no prior history of Echocardiogram examinations.                  Abnormal ECG; Risk Factors:Current Smoker and Diabetes. Elevated                 troponin.  Sonographer:    Roseanna Rainbow RDCS Referring Phys: [place]  1. 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 Discussed with Cardiology consulting team.  2. Left ventricular ejection fraction, by estimation, is 25 to 30%. The left ventricle has severely decreased function. The left ventricle demonstrates regional wall motion abnormalities.         All anterior LV segements, the basal-to-mid anterolateral LV segments, and mid-to-apical inferolateral LV segments appear severely hypokinetic. There is septal with flattening with diastole concerning for RV volume overload. Left ventricular diastolic parameters are consistent with Grade I diastolic dysfunction (impaired relaxation).  3. Right ventricular systolic function is severely reduced. The right ventricular size is moderately-to-severely enlarged. Tricuspid regurgitation signal is inadequate for assessing PA pressure.  4. Right atrial size was mildly dilated.  5. The mitral valve is normal in structure. Trivial mitral valve regurgitation.  6. The aortic valve is tricuspid. Aortic valve regurgitation is not visualized. No aortic stenosis is present. Comparison(s): No prior Echocardiogram. FINDINGS  Left Ventricle: Left ventricular ejection fraction, by estimation, is 25 to 30%. The left ventricle has severely decreased function. The left ventricle demonstrates regional wall motion abnormalities. All anterior LV segements, the basal-to-mid anterolateral LV segments, and mid-to-apical inferolateral LV segments appear severely hypokinetic. The left ventricular internal cavity size was normal in size. There is no left ventricular hypertrophy. The interventricular septum is flattened in diastole ('D' shaped left ventricle), consistent with right ventricular volume  overload. Left ventricular diastolic parameters are consistent with Grade I diastolic dysfunction (impaired relaxation). Right Ventricle: The right ventricular size is moderately-to-severely enlarged. No increase in right ventricular wall thickness. Right ventricular systolic function is severely reduced. Tricuspid regurgitation signal is inadequate for assessing PA pressure. Left Atrium: Left atrial size was normal in size. Right Atrium: Right atrial size was mildly dilated. Pericardium: There is no evidence of pericardial effusion. Mitral Valve: The mitral valve is normal in structure. There is mild thickening of the mitral valve leaflet(s). There is mild calcification of the mitral valve leaflet(s). Trivial mitral valve regurgitation. Tricuspid Valve: The tricuspid valve is normal in structure. Tricuspid valve regurgitation is trivial. Aortic Valve: The aortic valve is tricuspid. Aortic valve regurgitation is not visualized. No aortic stenosis is present. Pulmonic Valve: The pulmonic valve was normal in structure. Pulmonic valve regurgitation is trivial. Aorta: The aortic root is normal in size and structure. IAS/Shunts: No atrial level shunt detected by color flow Doppler.  LEFT VENTRICLE PLAX 2D LVIDd:  4.70 cm     Diastology LVIDs:         3.90 cm     LV e' medial:    5.98 cm/s LV PW:         1.30 cm     LV E/e' medial:  5.0 LV IVS:        1.10 cm     LV e' lateral:   9.03 cm/s LVOT diam:     2.10 cm     LV E/e' lateral: 3.3 LV SV:         28 LV SV Index:   14 LVOT Area:     3.46 cm  LV Volumes (MOD) LV vol d, MOD A2C: 71.4 ml LV vol d, MOD A4C: 61.4 ml LV vol s, MOD A2C: 49.6 ml LV vol s, MOD A4C: 46.8 ml LV SV MOD A2C:     21.8 ml LV SV MOD A4C:     61.4 ml LV SV MOD BP:      18.6 ml RIGHT VENTRICLE            IVC RV S prime:     5.33 cm/s  IVC diam: 2.20 cm TAPSE (M-mode): 1.2 cm LEFT ATRIUM             Index      RIGHT ATRIUM           Index LA diam:        2.90 cm 1.47 cm/m RA Area:     19.40 cm  LA Vol (A2C):   18.9 ml 9.56 ml/m RA Volume:   62.90 ml  31.81 ml/m LA Vol (A4C):   12.4 ml 6.27 ml/m LA Biplane Vol: 16.0 ml 8.09 ml/m  AORTIC VALVE LVOT Vmax:   78.90 cm/s LVOT Vmean:  60.400 cm/s LVOT VTI:    0.082 m  AORTA Ao Root diam: 2.90 cm Ao Asc diam:  3.50 cm MITRAL VALVE MV Area (PHT): 3.27 cm    SHUNTS MV Decel Time: 232 msec    Systemic VTI:  0.08 m MV E velocity: 29.80 cm/s  Systemic Diam: 2.10 cm MV A velocity: 57.95 cm/s MV E/A ratio:  0.51 Gwyndolyn Kaufman MD Electronically signed by Gwyndolyn Kaufman MD Signature Date/Time: 01/22/2021/3:34:04 PM    Final    VAS [place] LOWER EXTREMITY VENOUS (DVT)  Result Date: 01/23/2021  Lower Venous DVT Study Indications: Pulmonary embolism.  Risk Factors: Confirmed PE. Comparison Study: No prior studies. Performing Technologist: Oliver Hum RVT  Examination Guidelines: A complete evaluation includes B-mode imaging, spectral Doppler, color Doppler, and power Doppler as needed of all accessible portions of each vessel. Bilateral testing is considered an integral part of a complete examination. Limited examinations for reoccurring indications may be performed as noted. The reflux portion of the exam is performed with the patient in reverse Trendelenburg.  +---------+---------------+---------+-----------+----------+--------------+ RIGHT    CompressibilityPhasicitySpontaneityPropertiesThrombus Aging +---------+---------------+---------+-----------+----------+--------------+ CFV      Full           Yes      Yes                                 +---------+---------------+---------+-----------+----------+--------------+ SFJ      Full                                                        +---------+---------------+---------+-----------+----------+--------------+  FV Prox  Full                                                        +---------+---------------+---------+-----------+----------+--------------+ FV Mid   Full                                                         +---------+---------------+---------+-----------+----------+--------------+ FV DistalFull                                                        +---------+---------------+---------+-----------+----------+--------------+ PFV      Full                                                        +---------+---------------+---------+-----------+----------+--------------+ POP      Full           Yes      Yes                                 +---------+---------------+---------+-----------+----------+--------------+ PTV      Full                                                        +---------+---------------+---------+-----------+----------+--------------+ PERO     Full                                                        +---------+---------------+---------+-----------+----------+--------------+   +---------+---------------+---------+-----------+----------+--------------+ LEFT     CompressibilityPhasicitySpontaneityPropertiesThrombus Aging +---------+---------------+---------+-----------+----------+--------------+ CFV      Full           Yes      Yes                                 +---------+---------------+---------+-----------+----------+--------------+ SFJ      Full                                                        +---------+---------------+---------+-----------+----------+--------------+ FV Prox  Full                                                        +---------+---------------+---------+-----------+----------+--------------+  FV Mid   Full                                                        +---------+---------------+---------+-----------+----------+--------------+ FV DistalFull                                                        +---------+---------------+---------+-----------+----------+--------------+ PFV      Full                                                         +---------+---------------+---------+-----------+----------+--------------+ POP      Partial        Yes      Yes                  Acute          +---------+---------------+---------+-----------+----------+--------------+ PTV      None                                         Acute          +---------+---------------+---------+-----------+----------+--------------+ PERO     Full                                                        +---------+---------------+---------+-----------+----------+--------------+ Gastroc  Partial                                      Acute          +---------+---------------+---------+-----------+----------+--------------+     Summary: RIGHT: - There is no evidence of deep vein thrombosis in the lower extremity.  - No cystic structure found in the popliteal fossa.  LEFT: - Findings consistent with acute deep vein thrombosis involving the left popliteal vein, left posterior tibial veins, and left gastrocnemius veins. - No cystic structure found in the popliteal fossa.  *See table(s) above for measurements and observations. Electronically signed by Curt Jews MD on 01/23/2021 at 4:56:55 PM.    Final    IR INFUSION THROMBOL ARTERIAL INITIAL (MS)  Result Date: 01/23/2021 INDICATION: 60 year old male with intermediate-high risk acute pulmonary embolism with saddle morphology. EXAM: 1. Ultrasound-guided access of the right common femoral vein 2. Catheterization of the pulmonary artery 3. Pulmonary arterial manometry 4. Pulmonary angiography 5. Selective catheterization of the bilateral lobar and segmental pulmonary arteries 6. Mechanical thrombectomy of the bilateral pulmonary arteries 7. Perclose closure of the right common femoral vein COMPARISON:  Chest CT from 01/22/2021 MEDICATIONS: 8000 units heparin, intravenous ANESTHESIA/SEDATION: Versed 1 mg IV; Fentanyl 150 mcg IV Moderate Sedation Time:  106 minutes The patient was continuously monitored during the  procedure by the  interventional radiology nurse under my direct supervision. FLUOROSCOPY TIME:  Fluoroscopy Time: 9 minutes 42 seconds (83 mGy). COMPLICATIONS: None immediate. TECHNIQUE: Informed written consent was obtained from the patient after a thorough discussion of the procedural risks, benefits and alternatives. All questions were addressed. Maximal Sterile Barrier Technique was utilized including caps, mask, sterile gowns, sterile gloves, sterile drape, hand hygiene and skin antiseptic. A timeout was performed prior to the initiation of the procedure. The right groin was prepped and draped in standard fashion. Preprocedure ultrasound evaluation demonstrated patent, compressible right common femoral vein. The procedure was planned. Subdermal local anesthetic was administered at the planned needle entry site with 1% lidocaine. Approximately 1 cm skin nick was made and blunt subcutaneous dissection was performed with curved Kelly's. Under direct ultrasound visualization, the right common femoral vein was accessed with a 21 gauge micropuncture needle. Using the micropuncture set, a Rosen wire was directed to the inferior vena cava under fluoroscopic guidance. After serial dilation up to 10 [place], 2 ProGlide Perclose devices were deployed at the 10 o'clock and 2 o'clock positions of the common femoral vein. An 8 [place], 11 cm sheath was then placed. A pigtail catheter is in placed over the [facility] wire which was removed and exchanged for a tip deflecting wire. Under constant fluoroscopic guidance, the pigtail catheter was directed into the main pulmonary artery. Pulmonary manometry was performed and was 59/30, mean 40 mm Hg. Next, pulmonary angiography was performed which demonstrated multiple bilateral main, lobar, and segmental pulmonary emboli. The pigtail catheter was exchanged for a 5 French C2 over the [place] wire which was then directed into the right inferior subsegmental branches. The Rosen wire was  exchanged for a superstiff, short taper Amplatz wire. Next, serial dilation was performed and a 24 [place] Gore dry seal sheath was placed in the right common femoral artery. Through the sheath, the 24 [place] Inari ClotTriever mechanical thrombectomy device was directed to the right lower lobar segmental branch. Aspiration thrombectomy was performed with multiple passes yielding acute appearing thrombus. The yielded samples were aspirated and fresh, filter blood was returned to the patient via the indwelling femoral sheath. Right pulmonary angiography was performed which demonstrated adequate clearance of previously visualized pulmonary emboli. The indwelling wire and thrombectomy device were then positioned in the left pulmonary artery. Aspiration thrombectomy was performed with multiple passes yielding acute appearing thrombus. Left pulmonary angiography was then performed which demonstrated adequate clearance of previously visualized pulmonary emboli. Repeat pulmonary manometry was performed through the indwelling aspiration catheter and was 63/33, mean 44 mm Hg. The catheter and sheath were removed and the Perclose sutures were approximated, tied, and cut. Hemostasis was achieved with brief, gentle manual compression. The right groin incision was then approximated and covered with Dermabond. The patient tolerated the procedure well with intra procedural decreased pulse from 100s to 80s. The patient was transferred to the floor in good condition. FINDINGS: Acute bilateral saddle pulmonary emboli and pulmonary hypertension. Technically successful mechanical thrombectomy of bilateral pulmonary emboli. IMPRESSION: 1. Acute bilateral saddle pulmonary emboli and pulmonary hypertension. 2. Technically successful mechanical thrombectomy of bilateral pulmonary emboli. Ruthann Cancer, MD [facility] Electronically Signed   By: Ruthann Cancer MD   On: 01/23/2021 15:41    IR INFUSION THROMBOL ARTERIAL INITIAL (MS)  Result Date: 01/23/2021 INDICATION: 60 year old male with intermediate-high risk acute pulmonary embolism with saddle morphology. EXAM: 1. Ultrasound-guided access of the right common femoral vein 2. Catheterization of the pulmonary artery 3. Pulmonary  arterial manometry 4. Pulmonary angiography 5. Selective catheterization of the bilateral lobar and segmental pulmonary arteries 6. Mechanical thrombectomy of the bilateral pulmonary arteries 7. Perclose closure of the right common femoral vein COMPARISON:  Chest CT from 01/22/2021 MEDICATIONS: 8000 units heparin, intravenous ANESTHESIA/SEDATION: Versed 1 mg IV; Fentanyl 150 mcg IV Moderate Sedation Time:  106 minutes The patient was continuously monitored during the procedure by the interventional radiology nurse under my direct supervision. FLUOROSCOPY TIME:  Fluoroscopy Time: 9 minutes 42 seconds (83 mGy). COMPLICATIONS: None immediate. TECHNIQUE: Informed written consent was obtained from the patient after a thorough discussion of the procedural risks, benefits and alternatives. All questions were addressed. Maximal Sterile Barrier Technique was utilized including caps, mask, sterile gowns, sterile gloves, sterile drape, hand hygiene and skin antiseptic. A timeout was performed prior to the initiation of the procedure. The right groin was prepped and draped in standard fashion. Preprocedure ultrasound evaluation demonstrated patent, compressible right common femoral vein. The procedure was planned. Subdermal local anesthetic was administered at the planned needle entry site with 1% lidocaine. Approximately 1 cm skin nick was made and blunt subcutaneous dissection was performed with curved Kelly's. Under direct ultrasound visualization, the right common femoral vein was accessed with a 21 gauge micropuncture needle. Using the micropuncture set, a Rosen wire was directed to the inferior vena cava under fluoroscopic  guidance. After serial dilation up to 10 [place], 2 ProGlide Perclose devices were deployed at the 10 o'clock and 2 o'clock positions of the common femoral vein. An 8 [place], 11 cm sheath was then placed. A pigtail catheter is in placed over the [facility] wire which was removed and exchanged for a tip deflecting wire. Under constant fluoroscopic guidance, the pigtail catheter was directed into the main pulmonary artery. Pulmonary manometry was performed and was 59/30, mean 40 mm Hg. Next, pulmonary angiography was performed which demonstrated multiple bilateral main, lobar, and segmental pulmonary emboli. The pigtail catheter was exchanged for a 5 French C2 over the [place] wire which was then directed into the right inferior subsegmental branches. The Rosen wire was exchanged for a superstiff, short taper Amplatz wire. Next, serial dilation was performed and a 24 [place] Gore dry seal sheath was placed in the right common femoral artery. Through the sheath, the 24 [place] Inari ClotTriever mechanical thrombectomy device was directed to the right lower lobar segmental branch. Aspiration thrombectomy was performed with multiple passes yielding acute appearing thrombus. The yielded samples were aspirated and fresh, filter blood was returned to the patient via the indwelling femoral sheath. Right pulmonary angiography was performed which demonstrated adequate clearance of previously visualized pulmonary emboli. The indwelling wire and thrombectomy device were then positioned in the left pulmonary artery. Aspiration thrombectomy was performed with multiple passes yielding acute appearing thrombus. Left pulmonary angiography was then performed which demonstrated adequate clearance of previously visualized pulmonary emboli. Repeat pulmonary manometry was performed through the indwelling aspiration catheter and was 63/33, mean 44 mm Hg. The catheter and sheath were removed and the Perclose sutures were approximated, tied, and cut.  Hemostasis was achieved with brief, gentle manual compression. The right groin incision was then approximated and covered with Dermabond. The patient tolerated the procedure well with intra procedural decreased pulse from 100s to 80s. The patient was transferred to the floor in good condition. FINDINGS: Acute bilateral saddle pulmonary emboli and pulmonary hypertension. Technically successful mechanical thrombectomy of bilateral pulmonary emboli. IMPRESSION: 1. Acute bilateral saddle pulmonary emboli and pulmonary hypertension. 2. Technically successful mechanical thrombectomy of  bilateral pulmonary emboli. Ruthann Cancer, MD [facility] Electronically Signed   By: Ruthann Cancer MD   On: 01/23/2021 15:41     PERTINENT LAB RESULTS: CBC: Recent Labs    01/23/21 0328 01/24/21 0249  WBC 6.6 6.3  HGB 13.7 12.2*  HCT 41.5 36.9*  PLT 172 159   CMET CMP     Component Value Date/Time   NA 134 (L) 01/24/2021 0249   K 3.6 01/24/2021 0249   CL 104 01/24/2021 0249   CO2 26 01/24/2021 0249   GLUCOSE 142 (H) 01/24/2021 0249   BUN 11 01/24/2021 0249   CREATININE 0.75 01/24/2021 0249   CALCIUM 8.2 (L) 01/24/2021 0249   PROT 5.1 (L) 01/24/2021 0249   ALBUMIN 2.5 (L) 01/24/2021 0249   AST 19 01/24/2021 0249   ALT 22 01/24/2021 0249   ALKPHOS 70 01/24/2021 0249   BILITOT 0.2 (L) 01/24/2021 0249   GFRNONAA >60 01/24/2021 0249   GFRAA >60 01/18/2017 2140    GFR Estimated Creatinine Clearance: 93.8 mL/min (by C-G formula based on SCr of 0.75 mg/dL). No results for input(s): LIPASE, AMYLASE in the last 72 hours. No results for input(s): CKTOTAL, CKMB, CKMBINDEX, TROPONINI in the last 72 hours. Invalid input(s): POCBNP No results for input(s): DDIMER in the last 72 hours. Recent Labs    01/21/21 2131 01/22/21 1021  HGBA1C 13.7* 13.3*   Recent Labs    01/22/21 1021  CHOL 195  HDL 31*  LDLCALC 135*  TRIG 144  CHOLHDL 6.3   Recent  Labs    01/22/21 1021  TSH 0.681   No results for input(s): VITAMINB12, FOLATE, FERRITIN, TIBC, IRON, RETICCTPCT in the last 72 hours. Coags: No results for input(s): INR in the last 72 hours.  Invalid input(s): PT Microbiology: Recent Results (from the past 240 hour(s))  MRSA PCR Screening     Status: None   Collection Time: 01/21/21  9:56 PM   Specimen: Nasopharyngeal  Result Value Ref Range Status   MRSA by PCR NEGATIVE NEGATIVE Final    Comment:        The GeneXpert MRSA Assay (FDA approved for NASAL specimens only), is one component of a comprehensive MRSA colonization surveillance program. It is not intended to diagnose MRSA infection nor to guide or monitor treatment for MRSA infections. Performed at [facility], [place] [address], [place], [place] [address]   SARS CORONAVIRUS 2 (TAT 6-24 HRS) Nasopharyngeal Nasopharyngeal Swab     Status: None   Collection Time: 01/22/21 12:00 AM   Specimen: Nasopharyngeal Swab  Result Value Ref Range Status   SARS Coronavirus 2 NEGATIVE NEGATIVE Final    Comment: (NOTE) SARS-CoV-2 target nucleic acids are NOT DETECTED.  The SARS-CoV-2 RNA is generally detectable in upper and lower respiratory specimens during the acute phase of infection. Negative results do not preclude SARS-CoV-2 infection, do not rule out co-infections with other pathogens, and should not be used as the sole basis for treatment or other patient management decisions. Negative results must be combined with clinical observations, patient history, and epidemiological information. The expected result is Negative.  Fact Sheet for Patients: SugarRoll.be  Fact Sheet for Healthcare Providers: https://www.woods-mathews.com/  This test is not yet approved or cleared by the [place] FDA and  has been authorized for detection and/or diagnosis of SARS-CoV-2 by FDA under an Emergency Use Authorization  (EUA). This EUA will remain  in effect (meaning this test can be used) for the  duration of the COVID-19 declaration under Se ction 564(b)(1) of the Act, 21 U.S.C. section 360bbb-3(b)(1), unless the authorization is terminated or revoked sooner.  Performed at [facility], [place] [address], [place], [place] [address]     FURTHER DISCHARGE INSTRUCTIONS:  Get Medicines reviewed and adjusted: Please take all your medications with you for your next visit with your Primary MD  Laboratory/radiological data: Please request your Primary MD to go over all hospital tests and procedure/radiological results at the follow up, please ask your Primary MD to get all Hospital records sent to his/her office.  In some cases, they will be blood work, cultures and biopsy results pending at the time of your discharge. Please request that your primary care M.D. goes through all the records of your hospital data and follows up on these results.  Also Note the following: If you experience worsening of your admission symptoms, develop shortness of breath, life threatening emergency, suicidal or homicidal thoughts you must seek medical attention immediately by calling 911 or calling your MD immediately  if symptoms less severe.  You must read complete instructions/literature along with all the possible adverse reactions/side effects for all the Medicines you take and that have been prescribed to you. Take any new Medicines after you have completely understood and accpet all the possible adverse reactions/side effects.   Do not drive when taking Pain medications or sleeping medications (Benzodaizepines)  Do not take more than prescribed Pain, Sleep and Anxiety Medications. It is not advisable to combine anxiety,sleep and pain medications without talking with your primary care practitioner  Special Instructions: If you have smoked or chewed Tobacco  in the last 2 yrs please stop smoking, stop any regular Alcohol   and or any Recreational drug use.  Wear Seat belts while driving.  Please note: You were cared for by a hospitalist during your hospital stay. Once you are discharged, your primary care physician will handle any further medical issues. Please note that NO REFILLS for any discharge medications will be authorized once you are discharged, as it is imperative that you return to your primary care physician (or establish a relationship with a primary care physician if you do not have one) for your post hospital discharge needs so that they can reassess your need for medications and monitor your lab values.  Total Time spent coordinating discharge including counseling, education and face to face time equals 35 minutes.  SignedOren Binet 01/24/2021 11:45 AM

## 2021-01-24 NOTE — Progress Notes (Addendum)
ANTICOAGULATION CONSULT NOTE  Pharmacy Consult for IV heparin Indication: pulmonary embolus and LLE DVT  Allergies  Allergen Reactions  . Coconut Flavor Itching  . Bee Venom Rash    Patient Measurements: Height: 5\' 8"  (172.7 cm) Weight: 81.6 kg (180 lb) IBW/kg (Calculated) : 68.4 Heparin Dosing Weight: TBW  Vital Signs: Temp: 98.9 F (37.2 C) (04/20 0353) Temp Source: Oral (04/20 0353) BP: 105/71 (04/20 0600) Pulse Rate: 89 (04/20 0600)  Labs: Recent Labs    01/21/21 1758 01/21/21 1815 01/21/21 2131 01/22/21 0101 01/22/21 0445 01/22/21 1905 01/23/21 0328 01/23/21 0741 01/23/21 1007 01/24/21 0249  HGB  --   --  15.7  --   --   --  13.7  --   --  12.2*  HCT  --   --  46.3  --   --   --  41.5  --   --  36.9*  PLT  --   --  179  --   --   --  172  --   --  159  HEPARINUNFRC  --   --   --   --   --   --  0.58  --  0.55 0.56  CREATININE  --  1.15 0.87   < > 0.85  --   --  0.81  --  0.75  TROPONINIHS 188* 192*  --   --   --  61*  --   --   --   --    < > = values in this interval not displayed.    Estimated Creatinine Clearance: 93.8 mL/min (by C-G formula based on SCr of 0.75 mg/dL).  Assessment: 48 yoM admitted 4/17 for new onset DM with DKA; noted to also have elevated troponins. 2D echo appeared abnormal, prompting CTA, which revealed extensive PE w/ RV strain. He is now s/p mechanical thrombectomy. Dopplers with acute LLE DVT as well. Pharmacy consulted to dose IV heparin.  Heparin level is therapeutic at 0.56, on 1500 units/hr. CBC stable, no s/sx of bleeding or infusion issues.   Goal of Therapy: Heparin level 0.3-0.7 units/ml Monitor platelets by anticoagulation protocol: Yes  Plan: Continue heparin 1500 units/hr IV infusion Monitor daily HL, CBC, and for s/sx of bleeding  F/u transition to Eliquis soon  5/17, PharmD, BCPS PGY2 Cardiology Pharmacy Resident Phone: [phone] 01/24/2021  8:57 AM  Please check [email or web address] for unit-specific  pharmacy phone numbers.

## 2021-01-24 NOTE — Progress Notes (Addendum)
Stable night.  Wants to go home. I think sending him today with eliquis is reasonable provided there is plan for his DM, will message primary.  Available PRN.  Myrla Halsted MD PCCM

## 2021-01-24 NOTE — Progress Notes (Addendum)
Heart Failure Nurse Navigator Progress Note  Navigator filled pill box for patient. Concern for safe medication compliance. Educated on importance of new regimen and must take eliquis twice daily. Pt stated he understands but would also ask questions regarding timing of medication administration. Pt plan is to take AM meds around 9AM when he gets up for the day. PM meds are to be taken with dinner around 6PM. Pt states he does not go to bed until after 1AM. Plan for "grey-long-acting-Lantus" pen is to take at 10PM nightly. Pt was confused on which insulin pen to use, how much, and when. This RN showed patient how to use his glucometer, strips, and lancets. Showed patient his SS for his BLUE novolog pen, how to attach, prime needle, and also how to inject into and or inner thigh. Educated to check and inject 15 minutes prior to a meal. Pt educated to check CBG 4x daily.  Concern for proper diabetes management with insulin pens, pt would benefit from more/continued education.  Asked bedside RN to see if diabetes coordinator was available for more thorough hands on education prior to DC.   Teachback method used, pt able to verified plan for pill box medication administration and insulin management. Bedside RN present, reinforced teaching with DC AVS instructions. Pt states he lives with his sister and his ex-girlfriend is a diabetic so he plans to reach out to her for assistance.  Reminded to bring all medications and pill box to [facility] TOC visit Monday at 11AM.  Ozella Rocks, RN, BSN Heart Failure Nurse Navigator [phone]

## 2021-01-24 NOTE — Progress Notes (Signed)
Referring Physician(s): Dr Manuel Long Dr Manuel Long  Supervising Physician: Manuel Long  Patient Status:  [name] - In-pt  Chief Complaint:  Intermediate-high risk pulmonary embolism with saddle morphology  Subjective:  Procedure: 01/23/21 in IR 1) Pulmonary angiogram 2) Bilateral pulmonary artery mechanical thrombectom  Findings: Please refer to procedural dictation for full description.  24 Fr ClotTriever via right common femoral vein.  Pre-closed with 2 Proglides.    Pt is up in bed Denies complaints Breathing easily Eating; No N/V  UOP good- no bleeding    Allergies: Coconut flavor and Bee venom  Medications: Prior to Admission medications   Medication Sig Start Date End Date Taking? Authorizing Provider  cyclobenzaprine (FLEXERIL) 10 MG tablet Take 10 mg by mouth 3 (three) times daily as needed for muscle spasms. 09/05/20  Yes [provider]  naproxen (NAPROSYN) 500 MG tablet Take 500 mg by mouth 2 (two) times daily as needed for moderate pain. 12/19/20  Yes [provider]  sildenafil (VIAGRA) 100 MG tablet Take 50 mg by mouth as needed for erectile dysfunction. 12/01/20  Yes [provider]  ibuprofen (ADVIL,MOTRIN) 600 MG tablet Take 1 tablet (600 mg total) by mouth every 6 (six) hours as needed. Patient not taking: Reported on 01/21/2021 01/18/17   Manuel Lefevre, MD  methocarbamol (ROBAXIN) 500 MG tablet Take 1 tablet (500 mg total) by mouth 2 (two) times daily. Patient not taking: Reported on 01/21/2021 09/17/19   Manuel Lodge, PA-C  predniSONE (STERAPRED UNI-PAK 21 TAB) 10 MG (21) TBPK tablet Take 6 tabs by mouth daily  for 2 days, then 5 tabs for 2 days, then 4 tabs for 2 days, then 3 tabs for 2 days, 2 tabs for 2 days, then 1 tab by mouth daily for 2 days Patient not taking: Reported on 01/21/2021 09/17/19   Manuel Lodge, PA-C     Vital Signs: BP 137/90   Pulse 80   Temp 98.9 F (37.2 C) (Oral)   Resp 16   Ht  (1.727  m)   Wt 180 lb (81.6 kg)   SpO2 96%   BMI 27.37 kg/m   Physical Exam Vitals reviewed.  Pulmonary:     Effort: Pulmonary effort is normal.     Breath sounds: Normal breath sounds.  Abdominal:     Palpations: Abdomen is soft.     Tenderness: There is no abdominal tenderness.  Musculoskeletal:        General: Normal range of motion.     Comments: Right groin is tender to touch 1.5 cm area of puffiness at site Surgical glue intact at site of venous access Good pulses in foot  Skin:    General: Skin is warm.  Neurological:     Mental Status: He is oriented to person, place, and time.  Psychiatric:        Behavior: Behavior normal.     Imaging: DG Chest 2 View  Result Date: 01/21/2021 CLINICAL DATA:  Tachycardia EXAM: CHEST - 2 VIEW COMPARISON:  07/31/2020 FINDINGS: The heart size and mediastinal contours are within normal limits. Both lungs are clear. The visualized skeletal structures are unremarkable. IMPRESSION: No active cardiopulmonary disease. Electronically Signed   By: Manuel Numbers MD   On: 01/21/2021 17:22   CT ANGIO CHEST PE W OR WO CONTRAST  Result Date: 01/22/2021 CLINICAL DATA:  Right ventricular dysfunction on echocardiogram. Rule out pulmonary embolus. EXAM: CT ANGIOGRAPHY CHEST WITH CONTRAST TECHNIQUE: Multidetector CT imaging of the  chest was performed using the standard protocol during bolus administration of intravenous contrast. Multiplanar CT image reconstructions and MIPs were obtained to evaluate the vascular anatomy. CONTRAST:  OMNIPAQUE IOHEXOL 350 MG/ML SOLN COMPARISON:  None. FINDINGS: Cardiovascular: Examination is positive for large saddle embolus as well as extensive filling defects involving the lobar and segmental pulmonary arteries to both upper and lower lobes as well as the right middle lobe and lingula. There is evidence of right heart strain with RV to LV ratio of 1.4. Reflux of contrast material from the right atrium into the IVC and  hepatic veins noted. The heart size appears normal. No pericardial effusion. Aortic atherosclerosis. Coronary artery calcifications. Mediastinum/Nodes: No enlarged mediastinal, hilar, or axillary lymph nodes. Thyroid gland, trachea, and esophagus demonstrate no significant findings. Lungs/Pleura: No significant pleural fluid. Peripheral areas of subpleural ground-glass attenuation noted overlying the right middle lobe compatible with areas of pulmonary infarct. No airspace consolidation. Upper Abdomen: No acute findings within the imaged portions of the upper abdomen. Musculoskeletal: No chest wall abnormality. No acute or significant osseous findings. Review of the MIP images confirms the above findings. IMPRESSION: 1. Positive for acute PE with CT evidence of right heart strain (RV/LV Ratio = 1.4) consistent with at least submassive (intermediate risk) PE. The presence of right heart strain has been associated with an increased risk of morbidity and mortality. 2. Aortic Atherosclerosis (ICD10-I70.0). Coronary artery calcifications. Critical Value/emergent results were called by telephone at the time of interpretation on 01/22/2021 at 5:54 pm to provider Dr. Janee Long, who verbally acknowledged these results. Electronically Signed   By: Manuel Long M.D.   On: 01/22/2021 17:55   IR Angiogram Pulmonary Bilateral Selective  Result Date: 01/23/2021 INDICATION: 60 year old male with intermediate-high risk acute pulmonary embolism with saddle morphology. EXAM: 1. Ultrasound-guided access of the right common femoral vein 2. Catheterization of the pulmonary artery 3. Pulmonary arterial manometry 4. Pulmonary angiography 5. Selective catheterization of the bilateral lobar and segmental pulmonary arteries 6. Mechanical thrombectomy of the bilateral pulmonary arteries 7. Perclose closure of the right common femoral vein COMPARISON:  Chest CT from 01/22/2021 MEDICATIONS: 8000 units heparin, intravenous  ANESTHESIA/SEDATION: Versed 1 mg IV; Fentanyl 150 mcg IV Moderate Sedation Time:  106 minutes The patient was continuously monitored during the procedure by the interventional radiology nurse under my direct supervision. FLUOROSCOPY TIME:  Fluoroscopy Time: 9 minutes 42 seconds (83 mGy). COMPLICATIONS: None immediate. TECHNIQUE: Informed written consent was obtained from the patient after a thorough discussion of the procedural risks, benefits and alternatives. All questions were addressed. Maximal Sterile Barrier Technique was utilized including caps, mask, sterile gowns, sterile gloves, sterile drape, hand hygiene and skin antiseptic. A timeout was performed prior to the initiation of the procedure. The right groin was prepped and draped in standard fashion. Preprocedure ultrasound evaluation demonstrated patent, compressible right common femoral vein. The procedure was planned. Subdermal local anesthetic was administered at the planned needle entry site with 1% lidocaine. Approximately 1 cm skin nick was made and blunt subcutaneous dissection was performed with curved Kelly's. Under direct ultrasound visualization, the right common femoral vein was accessed with a 21 gauge micropuncture needle. Using the micropuncture set, a Rosen wire was directed to the inferior vena cava under fluoroscopic guidance. After serial dilation up to 10 [place], 2 ProGlide Perclose devices were deployed at the 10 o'clock and 2 o'clock positions of the common femoral vein. An 8 [place], 11 cm sheath was then placed. A pigtail catheter is in placed over  the Rosen wire which was removed and exchanged for a tip deflecting wire. Under constant fluoroscopic guidance, the pigtail catheter was directed into the main pulmonary artery. Pulmonary manometry was performed and was 59/30, mean 40 mm Hg. Next, pulmonary angiography was performed which demonstrated multiple bilateral main, lobar, and segmental pulmonary emboli. The pigtail catheter was  exchanged for a 5 French C2 over the [place] wire which was then directed into the right inferior subsegmental branches. The Rosen wire was exchanged for a superstiff, short taper Amplatz wire. Next, serial dilation was performed and a 24 [place] Gore dry seal sheath was placed in the right common femoral artery. Through the sheath, the 24 [place] Inari ClotTriever mechanical thrombectomy device was directed to the right lower lobar segmental branch. Aspiration thrombectomy was performed with multiple passes yielding acute appearing thrombus. The yielded samples were aspirated and fresh, filter blood was returned to the patient via the indwelling femoral sheath. Right pulmonary angiography was performed which demonstrated adequate clearance of previously visualized pulmonary emboli. The indwelling wire and thrombectomy device were then positioned in the left pulmonary artery. Aspiration thrombectomy was performed with multiple passes yielding acute appearing thrombus. Left pulmonary angiography was then performed which demonstrated adequate clearance of previously visualized pulmonary emboli. Repeat pulmonary manometry was performed through the indwelling aspiration catheter and was 63/33, mean 44 mm Hg. The catheter and sheath were removed and the Perclose sutures were approximated, tied, and cut. Hemostasis was achieved with brief, gentle manual compression. The right groin incision was then approximated and covered with Dermabond. The patient tolerated the procedure well with intra procedural decreased pulse from 100s to 80s. The patient was transferred to the floor in good condition. FINDINGS: Acute bilateral saddle pulmonary emboli and pulmonary hypertension. Technically successful mechanical thrombectomy of bilateral pulmonary emboli. IMPRESSION: 1. Acute bilateral saddle pulmonary emboli and pulmonary hypertension. 2. Technically successful mechanical thrombectomy of bilateral pulmonary emboli. Manuel Coots, MD  [facility] Radiology Electronically Signed   By: Manuel Coots MD   On: 01/23/2021 15:41   IR Angiogram Selective Each Additional Vessel  Result Date: 01/23/2021 INDICATION: 60 year old male with intermediate-high risk acute pulmonary embolism with saddle morphology. EXAM: 1. Ultrasound-guided access of the right common femoral vein 2. Catheterization of the pulmonary artery 3. Pulmonary arterial manometry 4. Pulmonary angiography 5. Selective catheterization of the bilateral lobar and segmental pulmonary arteries 6. Mechanical thrombectomy of the bilateral pulmonary arteries 7. Perclose closure of the right common femoral vein COMPARISON:  Chest CT from 01/22/2021 MEDICATIONS: 8000 units heparin, intravenous ANESTHESIA/SEDATION: Versed 1 mg IV; Fentanyl 150 mcg IV Moderate Sedation Time:  106 minutes The patient was continuously monitored during the procedure by the interventional radiology nurse under my direct supervision. FLUOROSCOPY TIME:  Fluoroscopy Time: 9 minutes 42 seconds (83 mGy). COMPLICATIONS: None immediate. TECHNIQUE: Informed written consent was obtained from the patient after a thorough discussion of the procedural risks, benefits and alternatives. All questions were addressed. Maximal Sterile Barrier Technique was utilized including caps, mask, sterile gowns, sterile gloves, sterile drape, hand hygiene and skin antiseptic. A timeout was performed prior to the initiation of the procedure. The right groin was prepped and draped in standard fashion. Preprocedure ultrasound evaluation demonstrated patent, compressible right common femoral vein. The procedure was planned. Subdermal local anesthetic was administered at the planned needle entry site with 1% lidocaine. Approximately 1 cm skin nick was made and blunt subcutaneous dissection was performed with curved Kelly's. Under direct ultrasound visualization, the right common  femoral vein was  accessed with a 21 gauge micropuncture needle. Using the micropuncture set, a Rosen wire was directed to the inferior vena cava under fluoroscopic guidance. After serial dilation up to 10 [place], 2 ProGlide Perclose devices were deployed at the 10 o'clock and 2 o'clock positions of the common femoral vein. An 8 [place], 11 cm sheath was then placed. A pigtail catheter is in placed over the [facility] wire which was removed and exchanged for a tip deflecting wire. Under constant fluoroscopic guidance, the pigtail catheter was directed into the main pulmonary artery. Pulmonary manometry was performed and was 59/30, mean 40 mm Hg. Next, pulmonary angiography was performed which demonstrated multiple bilateral main, lobar, and segmental pulmonary emboli. The pigtail catheter was exchanged for a 5 French C2 over the [place] wire which was then directed into the right inferior subsegmental branches. The Rosen wire was exchanged for a superstiff, short taper Amplatz wire. Next, serial dilation was performed and a 24 [place] Gore dry seal sheath was placed in the right common femoral artery. Through the sheath, the 24 [place] Inari ClotTriever mechanical thrombectomy device was directed to the right lower lobar segmental branch. Aspiration thrombectomy was performed with multiple passes yielding acute appearing thrombus. The yielded samples were aspirated and fresh, filter blood was returned to the patient via the indwelling femoral sheath. Right pulmonary angiography was performed which demonstrated adequate clearance of previously visualized pulmonary emboli. The indwelling wire and thrombectomy device were then positioned in the left pulmonary artery. Aspiration thrombectomy was performed with multiple passes yielding acute appearing thrombus. Left pulmonary angiography was then performed which demonstrated adequate clearance of previously visualized pulmonary emboli. Repeat pulmonary manometry was performed through the indwelling  aspiration catheter and was 63/33, mean 44 mm Hg. The catheter and sheath were removed and the Perclose sutures were approximated, tied, and cut. Hemostasis was achieved with brief, gentle manual compression. The right groin incision was then approximated and covered with Dermabond. The patient tolerated the procedure well with intra procedural decreased pulse from 100s to 80s. The patient was transferred to the floor in good condition. FINDINGS: Acute bilateral saddle pulmonary emboli and pulmonary hypertension. Technically successful mechanical thrombectomy of bilateral pulmonary emboli. IMPRESSION: 1. Acute bilateral saddle pulmonary emboli and pulmonary hypertension. 2. Technically successful mechanical thrombectomy of bilateral pulmonary emboli. Manuel Coots, MD [facility] Radiology Electronically Signed   By: Manuel Coots MD   On: 01/23/2021 15:41   IR Angiogram Selective Each Additional Vessel  Result Date: 01/23/2021 INDICATION: 60 year old male with intermediate-high risk acute pulmonary embolism with saddle morphology. EXAM: 1. Ultrasound-guided access of the right common femoral vein 2. Catheterization of the pulmonary artery 3. Pulmonary arterial manometry 4. Pulmonary angiography 5. Selective catheterization of the bilateral lobar and segmental pulmonary arteries 6. Mechanical thrombectomy of the bilateral pulmonary arteries 7. Perclose closure of the right common femoral vein COMPARISON:  Chest CT from 01/22/2021 MEDICATIONS: 8000 units heparin, intravenous ANESTHESIA/SEDATION: Versed 1 mg IV; Fentanyl 150 mcg IV Moderate Sedation Time:  106 minutes The patient was continuously monitored during the procedure by the interventional radiology nurse under my direct supervision. FLUOROSCOPY TIME:  Fluoroscopy Time: 9 minutes 42 seconds (83 mGy). COMPLICATIONS: None immediate. TECHNIQUE: Informed written consent was obtained from the patient after a  thorough discussion of the procedural risks, benefits and alternatives. All questions were addressed. Maximal Sterile Barrier Technique was utilized including caps, mask, sterile gowns, sterile gloves, sterile drape, hand hygiene and skin antiseptic. A timeout  was performed prior to the initiation of the procedure. The right groin was prepped and draped in standard fashion. Preprocedure ultrasound evaluation demonstrated patent, compressible right common femoral vein. The procedure was planned. Subdermal local anesthetic was administered at the planned needle entry site with 1% lidocaine. Approximately 1 cm skin nick was made and blunt subcutaneous dissection was performed with curved Kelly's. Under direct ultrasound visualization, the right common femoral vein was accessed with a 21 gauge micropuncture needle. Using the micropuncture set, a Rosen wire was directed to the inferior vena cava under fluoroscopic guidance. After serial dilation up to 10 [place], 2 ProGlide Perclose devices were deployed at the 10 o'clock and 2 o'clock positions of the common femoral vein. An 8 [place], 11 cm sheath was then placed. A pigtail catheter is in placed over the [facility] wire which was removed and exchanged for a tip deflecting wire. Under constant fluoroscopic guidance, the pigtail catheter was directed into the main pulmonary artery. Pulmonary manometry was performed and was 59/30, mean 40 mm Hg. Next, pulmonary angiography was performed which demonstrated multiple bilateral main, lobar, and segmental pulmonary emboli. The pigtail catheter was exchanged for a 5 French C2 over the [place] wire which was then directed into the right inferior subsegmental branches. The Rosen wire was exchanged for a superstiff, short taper Amplatz wire. Next, serial dilation was performed and a 24 [place] Gore dry seal sheath was placed in the right common femoral artery. Through the sheath, the 24 [place] Inari ClotTriever mechanical thrombectomy  device was directed to the right lower lobar segmental branch. Aspiration thrombectomy was performed with multiple passes yielding acute appearing thrombus. The yielded samples were aspirated and fresh, filter blood was returned to the patient via the indwelling femoral sheath. Right pulmonary angiography was performed which demonstrated adequate clearance of previously visualized pulmonary emboli. The indwelling wire and thrombectomy device were then positioned in the left pulmonary artery. Aspiration thrombectomy was performed with multiple passes yielding acute appearing thrombus. Left pulmonary angiography was then performed which demonstrated adequate clearance of previously visualized pulmonary emboli. Repeat pulmonary manometry was performed through the indwelling aspiration catheter and was 63/33, mean 44 mm Hg. The catheter and sheath were removed and the Perclose sutures were approximated, tied, and cut. Hemostasis was achieved with brief, gentle manual compression. The right groin incision was then approximated and covered with Dermabond. The patient tolerated the procedure well with intra procedural decreased pulse from 100s to 80s. The patient was transferred to the floor in good condition. FINDINGS: Acute bilateral saddle pulmonary emboli and pulmonary hypertension. Technically successful mechanical thrombectomy of bilateral pulmonary emboli. IMPRESSION: 1. Acute bilateral saddle pulmonary emboli and pulmonary hypertension. 2. Technically successful mechanical thrombectomy of bilateral pulmonary emboli. Manuel Coots, MD [facility] Radiology Electronically Signed   By: Manuel Coots MD   On: 01/23/2021 15:41   IR US Guide Vasc Access Right  Result Date: 01/23/2021 INDICATION: 60 year old male with intermediate-high risk acute pulmonary embolism with saddle morphology. EXAM: 1. Ultrasound-guided access of the right common femoral vein 2.  Catheterization of the pulmonary artery 3. Pulmonary arterial manometry 4. Pulmonary angiography 5. Selective catheterization of the bilateral lobar and segmental pulmonary arteries 6. Mechanical thrombectomy of the bilateral pulmonary arteries 7. Perclose closure of the right common femoral vein COMPARISON:  Chest CT from 01/22/2021 MEDICATIONS: 8000 units heparin, intravenous ANESTHESIA/SEDATION: Versed 1 mg IV; Fentanyl 150 mcg IV Moderate Sedation Time:  106 minutes The patient was continuously monitored during the procedure  by the interventional radiology nurse under my direct supervision. FLUOROSCOPY TIME:  Fluoroscopy Time: 9 minutes 42 seconds (83 mGy). COMPLICATIONS: None immediate. TECHNIQUE: Informed written consent was obtained from the patient after a thorough discussion of the procedural risks, benefits and alternatives. All questions were addressed. Maximal Sterile Barrier Technique was utilized including caps, mask, sterile gowns, sterile gloves, sterile drape, hand hygiene and skin antiseptic. A timeout was performed prior to the initiation of the procedure. The right groin was prepped and draped in standard fashion. Preprocedure ultrasound evaluation demonstrated patent, compressible right common femoral vein. The procedure was planned. Subdermal local anesthetic was administered at the planned needle entry site with 1% lidocaine. Approximately 1 cm skin nick was made and blunt subcutaneous dissection was performed with curved Kelly's. Under direct ultrasound visualization, the right common femoral vein was accessed with a 21 gauge micropuncture needle. Using the micropuncture set, a Rosen wire was directed to the inferior vena cava under fluoroscopic guidance. After serial dilation up to 10 [place], 2 ProGlide Perclose devices were deployed at the 10 o'clock and 2 o'clock positions of the common femoral vein. An 8 [place], 11 cm sheath was then placed. A pigtail catheter is in placed over the [facility]  wire which was removed and exchanged for a tip deflecting wire. Under constant fluoroscopic guidance, the pigtail catheter was directed into the main pulmonary artery. Pulmonary manometry was performed and was 59/30, mean 40 mm Hg. Next, pulmonary angiography was performed which demonstrated multiple bilateral main, lobar, and segmental pulmonary emboli. The pigtail catheter was exchanged for a 5 French C2 over the [place] wire which was then directed into the right inferior subsegmental branches. The Rosen wire was exchanged for a superstiff, short taper Amplatz wire. Next, serial dilation was performed and a 24 [place] Gore dry seal sheath was placed in the right common femoral artery. Through the sheath, the 24 [place] Inari ClotTriever mechanical thrombectomy device was directed to the right lower lobar segmental branch. Aspiration thrombectomy was performed with multiple passes yielding acute appearing thrombus. The yielded samples were aspirated and fresh, filter blood was returned to the patient via the indwelling femoral sheath. Right pulmonary angiography was performed which demonstrated adequate clearance of previously visualized pulmonary emboli. The indwelling wire and thrombectomy device were then positioned in the left pulmonary artery. Aspiration thrombectomy was performed with multiple passes yielding acute appearing thrombus. Left pulmonary angiography was then performed which demonstrated adequate clearance of previously visualized pulmonary emboli. Repeat pulmonary manometry was performed through the indwelling aspiration catheter and was 63/33, mean 44 mm Hg. The catheter and sheath were removed and the Perclose sutures were approximated, tied, and cut. Hemostasis was achieved with brief, gentle manual compression. The right groin incision was then approximated and covered with Dermabond. The patient tolerated the procedure well with intra procedural decreased pulse from 100s to 80s. The patient was  transferred to the floor in good condition. FINDINGS: Acute bilateral saddle pulmonary emboli and pulmonary hypertension. Technically successful mechanical thrombectomy of bilateral pulmonary emboli. IMPRESSION: 1. Acute bilateral saddle pulmonary emboli and pulmonary hypertension. 2. Technically successful mechanical thrombectomy of bilateral pulmonary emboli. Manuel Coots, MD [facility] Radiology Electronically Signed   By: Manuel Coots MD   On: 01/23/2021 15:41   ECHOCARDIOGRAM COMPLETE  Result Date: 01/22/2021    ECHOCARDIOGRAM REPORT   Patient Name:   Manuel [facility] Date of Exam: 01/22/2021 Medical Rec #:  [id number]  Height:       68.0 in Accession #:    [id number]         Weight:       185.0 lb Date of Birth:  Feb 16, 1961          BSA:          1.977 m Patient Age:    59 years           BP:           119/86 mmHg Patient Gender: M                  HR:           98 bpm. Exam Location:  Inpatient Procedure: 2D Echo, 3D Echo, Cardiac Doppler and Color Doppler Indications:    R07.9* Chest pain, unspecified  History:        Patient has no prior history of Echocardiogram examinations.                 Abnormal ECG; Risk Factors:Current Smoker and Diabetes. Elevated                 troponin.  Sonographer:    Sheralyn Boatman RDCS Referring Phys: [id number] DANIEL V THOMPSON IMPRESSIONS  1. 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 Discussed with Cardiology consulting team.  2. Left ventricular ejection fraction, by estimation, is 25 to 30%. The left ventricle has severely decreased function. The left ventricle demonstrates regional wall motion abnormalities.         All anterior LV segements, the basal-to-mid anterolateral LV segments, and mid-to-apical inferolateral LV segments appear severely hypokinetic. There is septal with flattening with diastole concerning for RV  volume overload. Left ventricular diastolic parameters are consistent with Grade I diastolic dysfunction (impaired relaxation).  3. Right ventricular systolic function is severely reduced. The right ventricular size is moderately-to-severely enlarged. Tricuspid regurgitation signal is inadequate for assessing PA pressure.  4. Right atrial size was mildly dilated.  5. The mitral valve is normal in structure. Trivial mitral valve regurgitation.  6. The aortic valve is tricuspid. Aortic valve regurgitation is not visualized. No aortic stenosis is present. Comparison(s): No prior Echocardiogram. FINDINGS  Left Ventricle: Left ventricular ejection fraction, by estimation, is 25 to 30%. The left ventricle has severely decreased function. The left ventricle demonstrates regional wall motion abnormalities. All anterior LV segements, the basal-to-mid anterolateral LV segments, and mid-to-apical inferolateral LV segments appear severely hypokinetic. The left ventricular internal cavity size was normal in size. There is no left ventricular hypertrophy. The interventricular septum is flattened in diastole ('D' shaped left ventricle), consistent with right ventricular volume overload. Left ventricular diastolic parameters are consistent with Grade I diastolic dysfunction (impaired relaxation). Right Ventricle: The right ventricular size is moderately-to-severely enlarged. No increase in right ventricular wall thickness. Right ventricular systolic function is severely reduced. Tricuspid regurgitation signal is inadequate for assessing PA pressure. Left Atrium: Left atrial size was normal in size. Right Atrium: Right atrial size was mildly dilated. Pericardium: There is no evidence of pericardial effusion. Mitral Valve: The mitral valve is normal in structure. There is mild thickening of the mitral valve leaflet(s). There is mild calcification of the mitral valve leaflet(s). Trivial mitral valve regurgitation. Tricuspid Valve: The  tricuspid valve is normal in structure. Tricuspid valve regurgitation is trivial. Aortic Valve: The aortic valve is tricuspid. Aortic valve regurgitation is not visualized. No aortic stenosis  is present. Pulmonic Valve: The pulmonic valve was normal in structure. Pulmonic valve regurgitation is trivial. Aorta: The aortic root is normal in size and structure. IAS/Shunts: No atrial level shunt detected by color flow Doppler.  LEFT VENTRICLE PLAX 2D LVIDd:         4.70 cm     Diastology LVIDs:         3.90 cm     LV e' medial:    5.98 cm/s LV PW:         1.30 cm     LV E/e' medial:  5.0 LV IVS:        1.10 cm     LV e' lateral:   9.03 cm/s LVOT diam:     2.10 cm     LV E/e' lateral: 3.3 LV SV:         28 LV SV Index:   14 LVOT Area:     3.46 cm  LV Volumes (MOD) LV vol d, MOD A2C: 71.4 ml LV vol d, MOD A4C: 61.4 ml LV vol s, MOD A2C: 49.6 ml LV vol s, MOD A4C: 46.8 ml LV SV MOD A2C:     21.8 ml LV SV MOD A4C:     61.4 ml LV SV MOD BP:      18.6 ml RIGHT VENTRICLE            IVC RV S prime:     5.33 cm/s  IVC diam: 2.20 cm TAPSE (M-mode): 1.2 cm LEFT ATRIUM             Index      RIGHT ATRIUM           Index LA diam:        2.90 cm 1.47 cm/m RA Area:     19.40 cm LA Vol (A2C):   18.9 ml 9.56 ml/m RA Volume:   62.90 ml  31.81 ml/m LA Vol (A4C):   12.4 ml 6.27 ml/m LA Biplane Vol: 16.0 ml 8.09 ml/m  AORTIC VALVE LVOT Vmax:   78.90 cm/s LVOT Vmean:  60.400 cm/s LVOT VTI:    0.082 m  AORTA Ao Root diam: 2.90 cm Ao Asc diam:  3.50 cm MITRAL VALVE MV Area (PHT): 3.27 cm    SHUNTS MV Decel Time: 232 msec    Systemic VTI:  0.08 m MV E velocity: 29.80 cm/s  Systemic Diam: 2.10 cm MV A velocity: 57.95 cm/s MV E/A ratio:  0.51 Laurance Flatten MD Electronically signed by Laurance Flatten MD Signature Date/Time: 01/22/2021/3:34:04 PM    Final    VAS [place] LOWER EXTREMITY VENOUS (DVT)  Result Date: 01/23/2021  Lower Venous DVT Study Indications: Pulmonary embolism.  Risk Factors: Confirmed PE. Comparison Study: No prior  studies. Performing Technologist: Chanda Busing RVT  Examination Guidelines: A complete evaluation includes B-mode imaging, spectral Doppler, color Doppler, and power Doppler as needed of all accessible portions of each vessel. Bilateral testing is considered an integral part of a complete examination. Limited examinations for reoccurring indications may be performed as noted. The reflux portion of the exam is performed with the patient in reverse Trendelenburg.  +---------+---------------+---------+-----------+----------+--------------+ RIGHT    CompressibilityPhasicitySpontaneityPropertiesThrombus Aging +---------+---------------+---------+-----------+----------+--------------+ CFV      Full           Yes      Yes                                 +---------+---------------+---------+-----------+----------+--------------+  SFJ      Full                                                        +---------+---------------+---------+-----------+----------+--------------+ FV Prox  Full                                                        +---------+---------------+---------+-----------+----------+--------------+ FV Mid   Full                                                        +---------+---------------+---------+-----------+----------+--------------+ FV DistalFull                                                        +---------+---------------+---------+-----------+----------+--------------+ PFV      Full                                                        +---------+---------------+---------+-----------+----------+--------------+ POP      Full           Yes      Yes                                 +---------+---------------+---------+-----------+----------+--------------+ PTV      Full                                                        +---------+---------------+---------+-----------+----------+--------------+ PERO     Full                                                         +---------+---------------+---------+-----------+----------+--------------+   +---------+---------------+---------+-----------+----------+--------------+ LEFT     CompressibilityPhasicitySpontaneityPropertiesThrombus Aging +---------+---------------+---------+-----------+----------+--------------+ CFV      Full           Yes      Yes                                 +---------+---------------+---------+-----------+----------+--------------+ SFJ      Full                                                        +---------+---------------+---------+-----------+----------+--------------+  FV Prox  Full                                                        +---------+---------------+---------+-----------+----------+--------------+ FV Mid   Full                                                        +---------+---------------+---------+-----------+----------+--------------+ FV DistalFull                                                        +---------+---------------+---------+-----------+----------+--------------+ PFV      Full                                                        +---------+---------------+---------+-----------+----------+--------------+ POP      Partial        Yes      Yes                  Acute          +---------+---------------+---------+-----------+----------+--------------+ PTV      None                                         Acute          +---------+---------------+---------+-----------+----------+--------------+ PERO     Full                                                        +---------+---------------+---------+-----------+----------+--------------+ Gastroc  Partial                                      Acute          +---------+---------------+---------+-----------+----------+--------------+     Summary: RIGHT: - There is no evidence of deep vein thrombosis in the lower  extremity.  - No cystic structure found in the popliteal fossa.  LEFT: - Findings consistent with acute deep vein thrombosis involving the left popliteal vein, left posterior tibial veins, and left gastrocnemius veins. - No cystic structure found in the popliteal fossa.  *See table(s) above for measurements and observations. Electronically signed by Gretta Began MD on 01/23/2021 at 4:56:55 PM.    Final    IR INFUSION THROMBOL ARTERIAL INITIAL (MS)  Result Date: 01/23/2021 INDICATION: 60 year old male with intermediate-high risk acute pulmonary embolism with saddle morphology. EXAM: 1. Ultrasound-guided access of the right common femoral vein 2. Catheterization of the pulmonary artery 3. Pulmonary arterial manometry 4. Pulmonary angiography 5. Selective catheterization of the  bilateral lobar and segmental pulmonary arteries 6. Mechanical thrombectomy of the bilateral pulmonary arteries 7. Perclose closure of the right common femoral vein COMPARISON:  Chest CT from 01/22/2021 MEDICATIONS: 8000 units heparin, intravenous ANESTHESIA/SEDATION: Versed 1 mg IV; Fentanyl 150 mcg IV Moderate Sedation Time:  106 minutes The patient was continuously monitored during the procedure by the interventional radiology nurse under my direct supervision. FLUOROSCOPY TIME:  Fluoroscopy Time: 9 minutes 42 seconds (83 mGy). COMPLICATIONS: None immediate. TECHNIQUE: Informed written consent was obtained from the patient after a thorough discussion of the procedural risks, benefits and alternatives. All questions were addressed. Maximal Sterile Barrier Technique was utilized including caps, mask, sterile gowns, sterile gloves, sterile drape, hand hygiene and skin antiseptic. A timeout was performed prior to the initiation of the procedure. The right groin was prepped and draped in standard fashion. Preprocedure ultrasound evaluation demonstrated patent, compressible right common femoral vein. The procedure was planned. Subdermal local  anesthetic was administered at the planned needle entry site with 1% lidocaine. Approximately 1 cm skin nick was made and blunt subcutaneous dissection was performed with curved Kelly's. Under direct ultrasound visualization, the right common femoral vein was accessed with a 21 gauge micropuncture needle. Using the micropuncture set, a Rosen wire was directed to the inferior vena cava under fluoroscopic guidance. After serial dilation up to 10 [place], 2 ProGlide Perclose devices were deployed at the 10 o'clock and 2 o'clock positions of the common femoral vein. An 8 [place], 11 cm sheath was then placed. A pigtail catheter is in placed over the [facility] wire which was removed and exchanged for a tip deflecting wire. Under constant fluoroscopic guidance, the pigtail catheter was directed into the main pulmonary artery. Pulmonary manometry was performed and was 59/30, mean 40 mm Hg. Next, pulmonary angiography was performed which demonstrated multiple bilateral main, lobar, and segmental pulmonary emboli. The pigtail catheter was exchanged for a 5 French C2 over the [place] wire which was then directed into the right inferior subsegmental branches. The Rosen wire was exchanged for a superstiff, short taper Amplatz wire. Next, serial dilation was performed and a 24 [place] Gore dry seal sheath was placed in the right common femoral artery. Through the sheath, the 24 [place] Inari ClotTriever mechanical thrombectomy device was directed to the right lower lobar segmental branch. Aspiration thrombectomy was performed with multiple passes yielding acute appearing thrombus. The yielded samples were aspirated and fresh, filter blood was returned to the patient via the indwelling femoral sheath. Right pulmonary angiography was performed which demonstrated adequate clearance of previously visualized pulmonary emboli. The indwelling wire and thrombectomy device were then positioned in the left pulmonary artery. Aspiration thrombectomy  was performed with multiple passes yielding acute appearing thrombus. Left pulmonary angiography was then performed which demonstrated adequate clearance of previously visualized pulmonary emboli. Repeat pulmonary manometry was performed through the indwelling aspiration catheter and was 63/33, mean 44 mm Hg. The catheter and sheath were removed and the Perclose sutures were approximated, tied, and cut. Hemostasis was achieved with brief, gentle manual compression. The right groin incision was then approximated and covered with Dermabond. The patient tolerated the procedure well with intra procedural decreased pulse from 100s to 80s. The patient was transferred to the floor in good condition. FINDINGS: Acute bilateral saddle pulmonary emboli and pulmonary hypertension. Technically successful mechanical thrombectomy of bilateral pulmonary emboli. IMPRESSION: 1. Acute bilateral saddle pulmonary emboli and pulmonary hypertension. 2. Technically successful mechanical thrombectomy of bilateral pulmonary emboli. Manuel Cootsylan Suttle, MD Vascular and Interventional Radiology  Specialists [facility] Radiology Electronically Signed   By: Manuel Coots MD   On: 01/23/2021 15:41   IR INFUSION THROMBOL ARTERIAL INITIAL (MS)  Result Date: 01/23/2021 INDICATION: 60 year old male with intermediate-high risk acute pulmonary embolism with saddle morphology. EXAM: 1. Ultrasound-guided access of the right common femoral vein 2. Catheterization of the pulmonary artery 3. Pulmonary arterial manometry 4. Pulmonary angiography 5. Selective catheterization of the bilateral lobar and segmental pulmonary arteries 6. Mechanical thrombectomy of the bilateral pulmonary arteries 7. Perclose closure of the right common femoral vein COMPARISON:  Chest CT from 01/22/2021 MEDICATIONS: 8000 units heparin, intravenous ANESTHESIA/SEDATION: Versed 1 mg IV; Fentanyl 150 mcg IV Moderate Sedation Time:  106 minutes The patient was continuously monitored during  the procedure by the interventional radiology nurse under my direct supervision. FLUOROSCOPY TIME:  Fluoroscopy Time: 9 minutes 42 seconds (83 mGy). COMPLICATIONS: None immediate. TECHNIQUE: Informed written consent was obtained from the patient after a thorough discussion of the procedural risks, benefits and alternatives. All questions were addressed. Maximal Sterile Barrier Technique was utilized including caps, mask, sterile gowns, sterile gloves, sterile drape, hand hygiene and skin antiseptic. A timeout was performed prior to the initiation of the procedure. The right groin was prepped and draped in standard fashion. Preprocedure ultrasound evaluation demonstrated patent, compressible right common femoral vein. The procedure was planned. Subdermal local anesthetic was administered at the planned needle entry site with 1% lidocaine. Approximately 1 cm skin nick was made and blunt subcutaneous dissection was performed with curved Kelly's. Under direct ultrasound visualization, the right common femoral vein was accessed with a 21 gauge micropuncture needle. Using the micropuncture set, a Rosen wire was directed to the inferior vena cava under fluoroscopic guidance. After serial dilation up to 10 [place], 2 ProGlide Perclose devices were deployed at the 10 o'clock and 2 o'clock positions of the common femoral vein. An 8 [place], 11 cm sheath was then placed. A pigtail catheter is in placed over the [facility] wire which was removed and exchanged for a tip deflecting wire. Under constant fluoroscopic guidance, the pigtail catheter was directed into the main pulmonary artery. Pulmonary manometry was performed and was 59/30, mean 40 mm Hg. Next, pulmonary angiography was performed which demonstrated multiple bilateral main, lobar, and segmental pulmonary emboli. The pigtail catheter was exchanged for a 5 French C2 over the [place] wire which was then directed into the right inferior subsegmental branches. The Rosen wire was  exchanged for a superstiff, short taper Amplatz wire. Next, serial dilation was performed and a 24 [place] Gore dry seal sheath was placed in the right common femoral artery. Through the sheath, the 24 [place] Inari ClotTriever mechanical thrombectomy device was directed to the right lower lobar segmental branch. Aspiration thrombectomy was performed with multiple passes yielding acute appearing thrombus. The yielded samples were aspirated and fresh, filter blood was returned to the patient via the indwelling femoral sheath. Right pulmonary angiography was performed which demonstrated adequate clearance of previously visualized pulmonary emboli. The indwelling wire and thrombectomy device were then positioned in the left pulmonary artery. Aspiration thrombectomy was performed with multiple passes yielding acute appearing thrombus. Left pulmonary angiography was then performed which demonstrated adequate clearance of previously visualized pulmonary emboli. Repeat pulmonary manometry was performed through the indwelling aspiration catheter and was 63/33, mean 44 mm Hg. The catheter and sheath were removed and the Perclose sutures were approximated, tied, and cut. Hemostasis was achieved with brief, gentle manual compression. The right groin incision was then approximated and covered with  Dermabond. The patient tolerated the procedure well with intra procedural decreased pulse from 100s to 80s. The patient was transferred to the floor in good condition. FINDINGS: Acute bilateral saddle pulmonary emboli and pulmonary hypertension. Technically successful mechanical thrombectomy of bilateral pulmonary emboli. IMPRESSION: 1. Acute bilateral saddle pulmonary emboli and pulmonary hypertension. 2. Technically successful mechanical thrombectomy of bilateral pulmonary emboli. Manuel Coots, MD Vascular and Interventional Radiology Specialists [facility] Radiology Electronically Signed   By: Manuel Coots MD   On: 01/23/2021 15:41     Labs:  CBC: Recent Labs    01/21/21 1623 01/21/21 2131 01/23/21 0328 01/24/21 0249  WBC 5.4 5.3 6.6 6.3  HGB 16.9 15.7 13.7 12.2*  HCT 55.0* 46.3 41.5 36.9*  PLT 195 179 172 159    COAGS: No results for input(s): INR, APTT in the last 8760 hours.  BMP: Recent Labs    01/22/21 0101 01/22/21 0445 01/23/21 0741 01/24/21 0249  NA 133* 135 135 134*  K 3.5 3.6 3.4* 3.6  CL 99 99 103 104  CO2 GLUCOSE 249* 173* 131* 142*  BUN CALCIUM 8.9 8.8* 8.4* 8.2*  CREATININE 0.83 0.85 0.81 0.75  GFRNONAA >60 >60 >60 >60    LIVER FUNCTION TESTS: Recent Labs    07/31/20 1242 01/24/21 0249  BILITOT 0.7 0.2*  AST 31 19  ALT 38 22  ALKPHOS 73 70  PROT 8.1 5.1*  ALBUMIN 4.5 2.5*    Assessment and Plan:  Discussed pt and findings with Dr Alan Ripper to start Eliquis from our standpoint Small venous hematoma at Rt groin site should resolve on own--- Pt is to be restful at home today--- no lifting over 10 lbs today Pt will hear from IR OP scheduler for follow up in next few days with Dr Elby Showers  Electronically Signed: Robet Leu, PA-C 01/24/2021, 9:46 AM   I spent a total of 25 Minutes at the the patient's bedside AND on the patient's hospital floor or unit, greater than 50% of which was counseling/coordinating care for post PE thrombectomy

## 2021-01-25 ENCOUNTER — Telehealth (HOSPITAL_COMMUNITY): Payer: Self-pay
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
## 2021-01-25 NOTE — Telephone Encounter (Signed)
Pharmacy Transitions of Care Follow-up Telephone Call  Date of discharge: 01/24/21 Discharge Diagnosis: mechanical thrombectomy  How have you been since you were released from the hospital? Patient   Medication changes made at discharge:  - START: ASA, Atorvastatin, Metformin, Metoprolol Succinate, Eliquis  - STOPPED: Ibuprofen, Methocarbamol, naproxen, Prednisone  - CHANGED: none  Medication changes verified by the patient? Yes    Medication Accessibility:  . Home Pharmacy: patient goes to the [place] for his office visits and pharmacy. Will receive refills from MD at [facility] at follow up    Medication Review:  APIXABAN (ELIQUIS)  Apixaban 10 mg BID initiated on 01/24/21. Will switch to apixaban 5 mg BID after 7 days.  - Discussed importance of taking medication around the same time everyday  - Reviewed potential DDIs with patient  - Advised patient of medications to avoid (NSAIDs, ASA)  - Educated that Tylenol (acetaminophen) will be the preferred analgesic to prevent risk of bleeding  - Emphasized importance of monitoring for signs and symptoms of bleeding (abnormal bruising, prolonged bleeding, nose bleeds, bleeding from gums, discolored urine, black tarry stools)  - Advised patient to alert all providers of anticoagulation therapy prior to starting a new medication or having a procedure   Follow-up Appointments:  PCP [facility] f[facility] appt [facility]   Specialist Hospital f/u appt confirmed? Has follow up next week with Cardiology at [facility] .   If their condition worsens, is the pt aware to call PCP or go to the Emergency Dept.? yes  Final Patient Assessment: Patient doing well and has follow ups scheduled.

## 2021-01-29 ENCOUNTER — Encounter (HOSPITAL_COMMUNITY): Payer: Self-pay

## 2021-01-29 ENCOUNTER — Telehealth (HOSPITAL_COMMUNITY): Payer: Self-pay

## 2021-01-29 ENCOUNTER — Other Ambulatory Visit: Payer: Self-pay

## 2021-01-29 ENCOUNTER — Ambulatory Visit (HOSPITAL_COMMUNITY)
Admit: 2021-01-29 | Discharge: 2021-01-29 | Disposition: A | Discharge status: Home / Self Care | Payer: No Typology Code available for payment source | Source: Ambulatory Visit | Attending: Internal Medicine | Admitting: Internal Medicine

## 2021-01-29 VITALS — BP 108/70 | HR 95 | Wt 161.5 lb

## 2021-01-29 DIAGNOSIS — Z833 Family history of diabetes mellitus: Secondary | ICD-10-CM | POA: Insufficient documentation

## 2021-01-29 DIAGNOSIS — Z7982 Long term (current) use of aspirin: Secondary | ICD-10-CM | POA: Insufficient documentation

## 2021-01-29 DIAGNOSIS — I5040 Unspecified combined systolic (congestive) and diastolic (congestive) heart failure: Secondary | ICD-10-CM | POA: Insufficient documentation

## 2021-01-29 DIAGNOSIS — I2510 Atherosclerotic heart disease of native coronary artery without angina pectoris: Secondary | ICD-10-CM | POA: Diagnosis not present

## 2021-01-29 DIAGNOSIS — Z79899 Other long term (current) drug therapy: Secondary | ICD-10-CM | POA: Insufficient documentation

## 2021-01-29 DIAGNOSIS — L259 Unspecified contact dermatitis, unspecified cause: Secondary | ICD-10-CM | POA: Insufficient documentation

## 2021-01-29 DIAGNOSIS — Z86711 Personal history of pulmonary embolism: Secondary | ICD-10-CM | POA: Insufficient documentation

## 2021-01-29 DIAGNOSIS — I2692 Saddle embolus of pulmonary artery without acute cor pulmonale: Secondary | ICD-10-CM | POA: Insufficient documentation

## 2021-01-29 DIAGNOSIS — I5022 Chronic systolic (congestive) heart failure: Secondary | ICD-10-CM | POA: Diagnosis not present

## 2021-01-29 DIAGNOSIS — E1169 Type 2 diabetes mellitus with other specified complication: Secondary | ICD-10-CM

## 2021-01-29 DIAGNOSIS — E1110 Type 2 diabetes mellitus with ketoacidosis without coma: Secondary | ICD-10-CM | POA: Insufficient documentation

## 2021-01-29 DIAGNOSIS — Z720 Tobacco use: Secondary | ICD-10-CM

## 2021-01-29 DIAGNOSIS — Z7901 Long term (current) use of anticoagulants: Secondary | ICD-10-CM | POA: Insufficient documentation

## 2021-01-29 DIAGNOSIS — Z794 Long term (current) use of insulin: Secondary | ICD-10-CM | POA: Insufficient documentation

## 2021-01-29 DIAGNOSIS — I959 Hypotension, unspecified: Secondary | ICD-10-CM | POA: Insufficient documentation

## 2021-01-29 DIAGNOSIS — F17210 Nicotine dependence, cigarettes, uncomplicated: Secondary | ICD-10-CM | POA: Insufficient documentation

## 2021-01-29 MED ORDER — SPIRONOLACTONE 25 MG PO TABS: 12.5000 mg | ORAL_TABLET | Freq: Every day | ORAL | 0 refills | Status: AC

## 2021-01-29 NOTE — Progress Notes (Signed)
Copy of pt's OV note printed and given to him to take to PCP appt at [facility] later today

## 2021-01-29 NOTE — Patient Instructions (Signed)
Start Spironolactone 12.5 mg (1/2 tab) Daily  Your physician recommends that you return for lab work in: 1 week  Thank you for allowing [place] to provider your heart failure care after your recent hospitalization. [facility]  Do the following things EVERYDAY: 1) Weigh yourself in the morning before breakfast. Write it down and keep it in a log. 2) Take your medicines as prescribed 3) Eat low salt foods--Limit salt (sodium) to 2000 mg per day.  4) Stay as active as you can everyday 5) Limit all fluids for the day to less than 2 liters  Weigh yourself EVERY morning after you go to the bathroom but before you eat or drink anything. Write this number down in a weight log/diary. If you gain 3 pounds overnight or 5 pounds in a week, call the heart failure clinic  Limit your fluid intake to less than 2 liters of fluid per day. Fluid includes all drinks, coffee, juice, ice chips, soup, jello, and all other liquids.  Restrict your sodium intake to less than 2000mg  per day. This will help prevent your body from holding onto fluid. Read food labels as a lot of canned and packaged foods have a lot of sodium.

## 2021-01-29 NOTE — Progress Notes (Signed)
Heart and Vascular Center Transitions of Care Clinic Heart Failure Pharmacist Encounter  Assisted patient with filling pill box for this week and educated on how to fill pill box for future fills. Patient was able to correctly fill three of his medications on his own with assistance. He states his ex-girlfriend is a CMA and could be available to help fill his pill box in the future if he has any further questions or concerns.  Sharen Hones, PharmD, BCPS Heart Failure Transitions of Care Clinic Pharmacist [phone]

## 2021-01-29 NOTE — Telephone Encounter (Signed)
Heart Failure Nurse Navigator Progress Note  Called to confirm Heart & Vascular Transitions of Care appointment at 11AM. Patient reminded to bring all medications and pill box organizer with them. Pt requested to have staff assist with pill box organization. Confirmed patient has transportation. Gave directions, instructed to utilize valet parking.  Confirmed appointment prior to ending call.   Ozella Rocks, RN, BSN Heart Failure Nurse Navigator [phone]

## 2021-02-01 ENCOUNTER — Other Ambulatory Visit (HOSPITAL_COMMUNITY): Payer: Self-pay

## 2021-02-06 ENCOUNTER — Other Ambulatory Visit: Payer: Self-pay

## 2021-02-06 ENCOUNTER — Ambulatory Visit (HOSPITAL_COMMUNITY)
Admission: RE | Admit: 2021-02-06 | Discharge: 2021-02-06 | Disposition: A | Discharge status: Home / Self Care | Payer: No Typology Code available for payment source | Source: Ambulatory Visit | Attending: Internal Medicine | Admitting: Internal Medicine

## 2021-02-06 DIAGNOSIS — I5022 Chronic systolic (congestive) heart failure: Secondary | ICD-10-CM

## 2021-02-06 LAB — BASIC METABOLIC PANEL
Anion gap: 10 (ref 5–15)
BUN: 12 mg/dL (ref 8–23)
CO2: 26 mmol/L (ref 22–32)
Calcium: 9.4 mg/dL (ref 8.9–10.3)
Chloride: 100 mmol/L (ref 98–111)
Creatinine, Ser: 0.76 mg/dL (ref 0.61–1.24)
GFR, Estimated: >60 mL/min (ref >60)
Glucose, Bld: 172 mg/dL — ABNORMAL HIGH (ref 70–99)
Potassium: 3.7 mmol/L (ref 3.5–5.1)
Sodium: 136 mmol/L (ref 135–145)

## 2021-02-14 ENCOUNTER — Other Ambulatory Visit: Payer: Self-pay

## 2021-02-14 ENCOUNTER — Ambulatory Visit
Admission: RE | Admit: 2021-02-14 | Discharge: 2021-02-14 | Disposition: A | Discharge status: Home / Self Care | Payer: No Typology Code available for payment source | Source: Ambulatory Visit | Attending: Radiology | Admitting: Radiology

## 2021-02-14 ENCOUNTER — Encounter: Payer: Self-pay | Admitting: *Deleted

## 2021-02-14 DIAGNOSIS — I2602 Saddle embolus of pulmonary artery with acute cor pulmonale: Secondary | ICD-10-CM

## 2021-02-14 HISTORY — PX: IR RADIOLOGIST EVAL & MGMT: IMG5224
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
## 2021-04-06 ENCOUNTER — Encounter: Payer: No Typology Code available for payment source | Attending: Internal Medicine | Admitting: Dietician

## 2021-04-06 ENCOUNTER — Encounter: Payer: Self-pay | Admitting: Dietician

## 2021-04-06 ENCOUNTER — Other Ambulatory Visit: Payer: Self-pay

## 2021-04-06 DIAGNOSIS — E1169 Type 2 diabetes mellitus with other specified complication: Secondary | ICD-10-CM

## 2021-04-06 DIAGNOSIS — E1110 Type 2 diabetes mellitus with ketoacidosis without coma: Secondary | ICD-10-CM | POA: Insufficient documentation
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
## 2021-04-06 NOTE — Patient Instructions (Addendum)
Rotate your insulin injection site. Take Metformin with Breakfast and Dinner  If you are unable to get your lancing devise to work, Ask your MD for lancets for the Microlet lancing devise.   Check your blood sugar each am before breakfast and 2 hours after dinner.    Aim for 4 Carb Choices per meal (60 grams) +/- 1 either way  Aim for 0-1 Carbs per snack if hungry  Include protein in moderation with your meals and snacks Consider reading food labels for Total Carbohydrate of foods Continue walking for 60 minutes daily as tolerated Consider checking BG at alternate times per day  Continue taking medication as directed by MD

## 2021-06-01 ENCOUNTER — Telehealth (HOSPITAL_COMMUNITY): Payer: Self-pay | Admitting: Internal Medicine

## 2021-06-01 NOTE — Telephone Encounter (Signed)
Received this message: Pt wants to set up appt for cardiac clearance, with what clinic, seen TOC last   Pt was seen 1 time in [facility] clinic back in April 2022 and [facility]  Attempted to call pt to advised he needs to obtain clearance from the [place] and left message on his VM

## 2021-06-14 ENCOUNTER — Ambulatory Visit: Payer: No Typology Code available for payment source | Admitting: Dietician

## 2021-06-15 ENCOUNTER — Other Ambulatory Visit: Payer: Self-pay

## 2021-06-15 ENCOUNTER — Encounter: Payer: Self-pay | Attending: Internal Medicine | Admitting: Dietician

## 2021-06-15 ENCOUNTER — Encounter: Payer: Self-pay | Admitting: Dietician

## 2021-06-15 DIAGNOSIS — E1169 Type 2 diabetes mellitus with other specified complication: Secondary | ICD-10-CM | POA: Insufficient documentation

## 2021-06-15 NOTE — Patient Instructions (Addendum)
Be sure to rotate where you are giving your insulin. Continue to take your medication as prescribed Consider checking your blood sugar daily.  This gives your doctor information to help treat you. Continue to stay active.  Rethink what you drink.  Aim for beverages without sugar.  Water is a great choice.  Drinking sugar makes it more difficult to control your blood sugar.    Aim for 4 Carb Choices per meal (60 grams) +/- 1 either way  Aim for 0-1 Carbs per snack if hungry  Include protein in moderation with your meals and snacks Consider reading food labels for Total Carbohydrate of foods Continue walking for 60 minutes daily as tolerated Consider checking BG at alternate times per day  Continue taking medication as directed by MD

## 2021-06-15 NOTE — Progress Notes (Signed)
Diabetes Self-Management Education  Visit Type: Follow-up  Appt. Start Time: 1110 Appt. End Time: 5643  06/15/2021  Mr. Manuel Long, identified by name and date of birth, is a 60 y.o. male with a diagnosis of Diabetes:  .   ASSESSMENT Patient is here today alone. He states that he has an appointment to check his A1C with the [facility] [place] in October.  This will determine if he is able to get shoulder surgery. He is walking once per day or more for 30-40 minutes. He continues to take Lantus and needs to rotate injection sites more. Drinking regular soda (40 oz daily). He is not checking his glucose most of the time.  Checked in the office today with glucose of 86 3 hours after breakfast. Reports some diarrhea with Metformin.  States that metformin was reduced to 500 mg and insulin was increased.  History includes newly diagnosed type 2 diabetes (01/2021), DKA, CAD with EF of 25030%, smoking (5 packs per week). A1C 13.3% 01/22/2021 decreased to 9% per patient at [facility] 2 03/2021. Medications include:  Spironolactone, Metformin, Lantus 20 units q HS.  Meal time insulin was discontinued at this time.     Patient lives with his sister.  She also has diabetes but only gets take out.  He does his own shopping and cooking. Patient is currently unemployeed.  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  Weight 169 lb (76.7 kg). Body mass index is 25.32 kg/m.   Diabetes Self-Management Education - 06/15/21 1221       Visit Information   Visit Type Follow-up      Initial Visit   Are you taking your medications as prescribed? Yes      Pre-Education Assessment   Patient understands the diabetes disease and treatment process. Demonstrates understanding / competency    Patient understands incorporating nutritional management into lifestyle. Needs Review    Patient undertands incorporating physical activity into lifestyle. Demonstrates understanding / competency    Patient understands  using medications safely. Needs Review    Patient understands monitoring blood glucose, interpreting and using results Needs Review    Patient understands prevention, detection, and treatment of acute complications. Demonstrates understanding / competency    Patient understands prevention, detection, and treatment of chronic complications. Demonstrates understanding / competency    Patient understands how to develop strategies to address psychosocial issues. Demonstrates understanding / competency    Patient understands how to develop strategies to promote health/change behavior. Needs Review      Complications   How often do you check your blood sugar? 0 times/day (not testing)      Dietary Intake   Breakfast often skips, today ate leftovers (mac & cheese, BBQ chicken, collards, 6 oz cheerwine, 6 oz juice) OR grits, eggs, ham or sausage    Snack (morning) none    Lunch sandwich (deli meta or hamburger) on [employer] with mayo, occasional chips    Snack (afternoon) ice cream sandwich    Dinner salad, chicken or steak, vegetables    Snack (evening) none    Beverage(s) water, propel water, 40 oz regular gingerale daily      Exercise   Exercise Type Light (walking / raking leaves)    How many days per week to you exercise? 7    How many minutes per day do you exercise? 45    Total minutes per week of exercise 315      Patient Education   Previous Diabetes Education  Yes (please comment)   04/2021   Nutrition management  Meal options for control of blood glucose level and chronic complications.;Other (comment)   beverage choices   Medications Reviewed patients medication for diabetes, action, purpose, timing of dose and side effects.    Monitoring Purpose and frequency of SMBG.      Individualized Goals (developed by patient)   Nutrition Follow meal plan discussed    Physical Activity Exercise 5-7 days per week;60 minutes per day    Medications take my medication as prescribed    Monitoring   test my blood glucose as discussed    Reducing Risk increase portions of healthy fats      Patient Self-Evaluation of Goals - Patient rates self as meeting previously set goals (% of time)   Nutrition 50 - 75 %    Physical Activity >75%    Medications >75%    Monitoring < 25%    Problem Solving >75%    Reducing Risk >75%    Health Coping >75%      Post-Education Assessment   Patient understands the diabetes disease and treatment process. Demonstrates understanding / competency    Patient understands incorporating nutritional management into lifestyle. Needs Review    Patient undertands incorporating physical activity into lifestyle. Demonstrates understanding / competency    Patient understands using medications safely. Demonstrates understanding / competency    Patient understands monitoring blood glucose, interpreting and using results Needs Review    Patient understands prevention, detection, and treatment of acute complications. Demonstrates understanding / competency    Patient understands prevention, detection, and treatment of chronic complications. Demonstrates understanding / competency    Patient understands how to develop strategies to address psychosocial issues. Demonstrates understanding / competency    Patient understands how to develop strategies to promote health/change behavior. Needs Review      Outcomes   Expected Outcomes Demonstrated interest in learning. Expect positive outcomes    Future DMSE PRN    Program Status Completed      Subsequent Visit   Since your last visit have you continued or begun to take your medications as prescribed? Yes    Since your last visit have you experienced any weight changes? Loss    Weight Loss (lbs) 6    Since your last visit, are you checking your blood glucose at least once a day? No             Individualized Plan for Diabetes Self-Management Training:   Learning Objective:  Patient will have a greater understanding  of diabetes self-management. Patient education plan is to attend individual and/or group sessions per assessed needs and concerns.   Plan:   Patient Instructions  Be sure to rotate where you are giving your insulin. Continue to take your medication as prescribed Consider checking your blood sugar daily.  This gives your doctor information to help treat you. Continue to stay active.  Rethink what you drink.  Aim for beverages without sugar.  Water is a great choice.  Drinking sugar makes it more difficult to control your blood sugar.    Aim for 4 Carb Choices per meal (60 grams) +/- 1 either way  Aim for 0-1 Carbs per snack if hungry  Include protein in moderation with your meals and snacks Consider reading food labels for Total Carbohydrate of foods Continue walking for 60 minutes daily as tolerated Consider checking BG at alternate times per day  Continue taking medication as directed by MD Expected Outcomes:  Demonstrated interest in learning. Expect positive outcomes  Education material provided:   If problems or questions, patient to contact team via:  Phone  Future DSME appointment: PRN

## 2022-06-13 IMAGING — US IR INFUSION THROMBOL ARTERIAL INITIAL (MS)
1 series · 2 of 2 positions shown · non-contrast
Comparison: Chest CT from 01/22/2021

INDICATION: 59-year-old male with intermediate-high risk acute pulmonary
embolism with saddle morphology.
TECHNIQUE: Informed written consent was obtained from the patient after a
thorough discussion of the procedural risks, benefits and
alternatives. All questions were addressed. Maximal Sterile Barrier
Technique was utilized including caps, mask, sterile gowns, sterile
gloves, sterile drape, hand hygiene and skin antiseptic. A timeout
was performed prior to the initiation of the procedure.

[Series 1: ir infusion thrombol arterial initial (ms) · 2 of 2 slices shown]
[im 1/2]
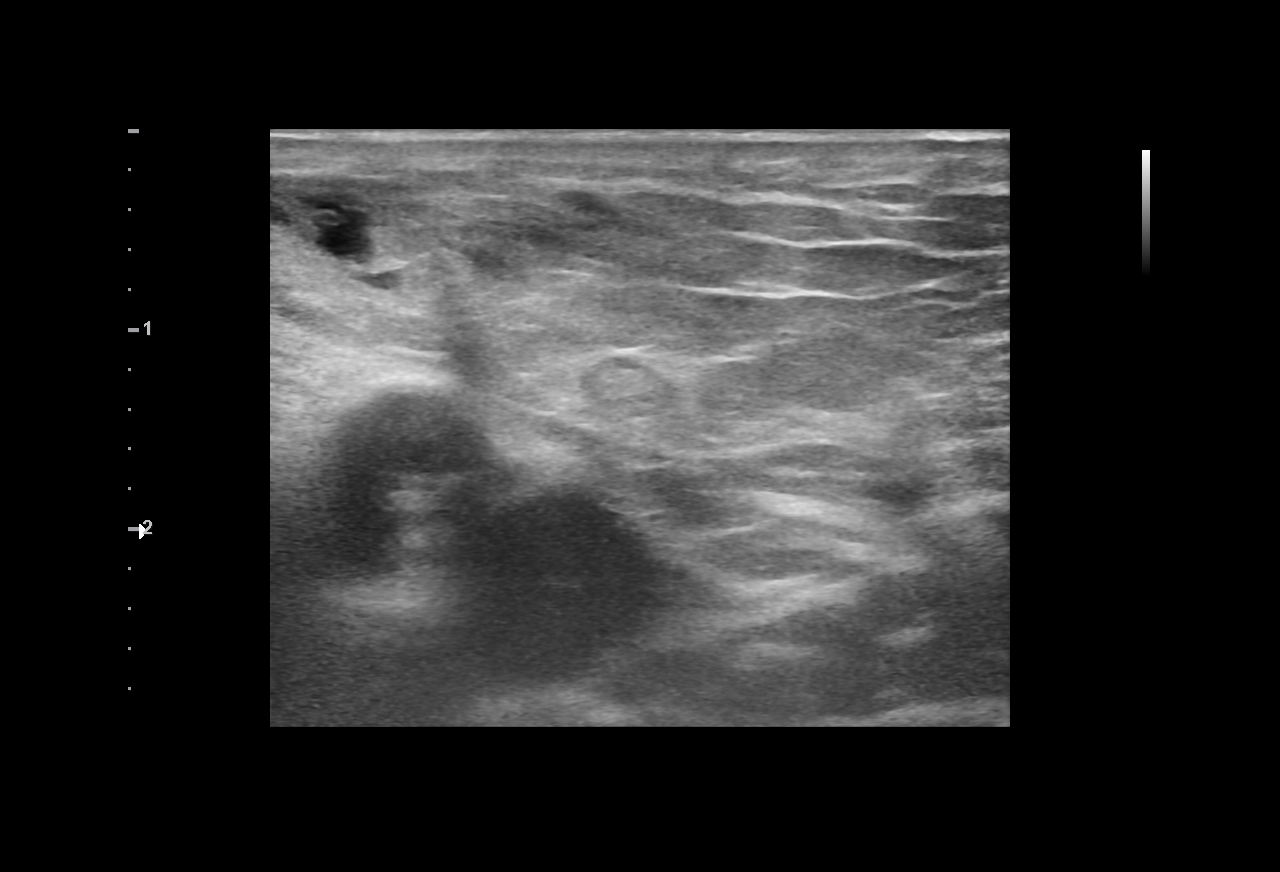
[im 2/2]
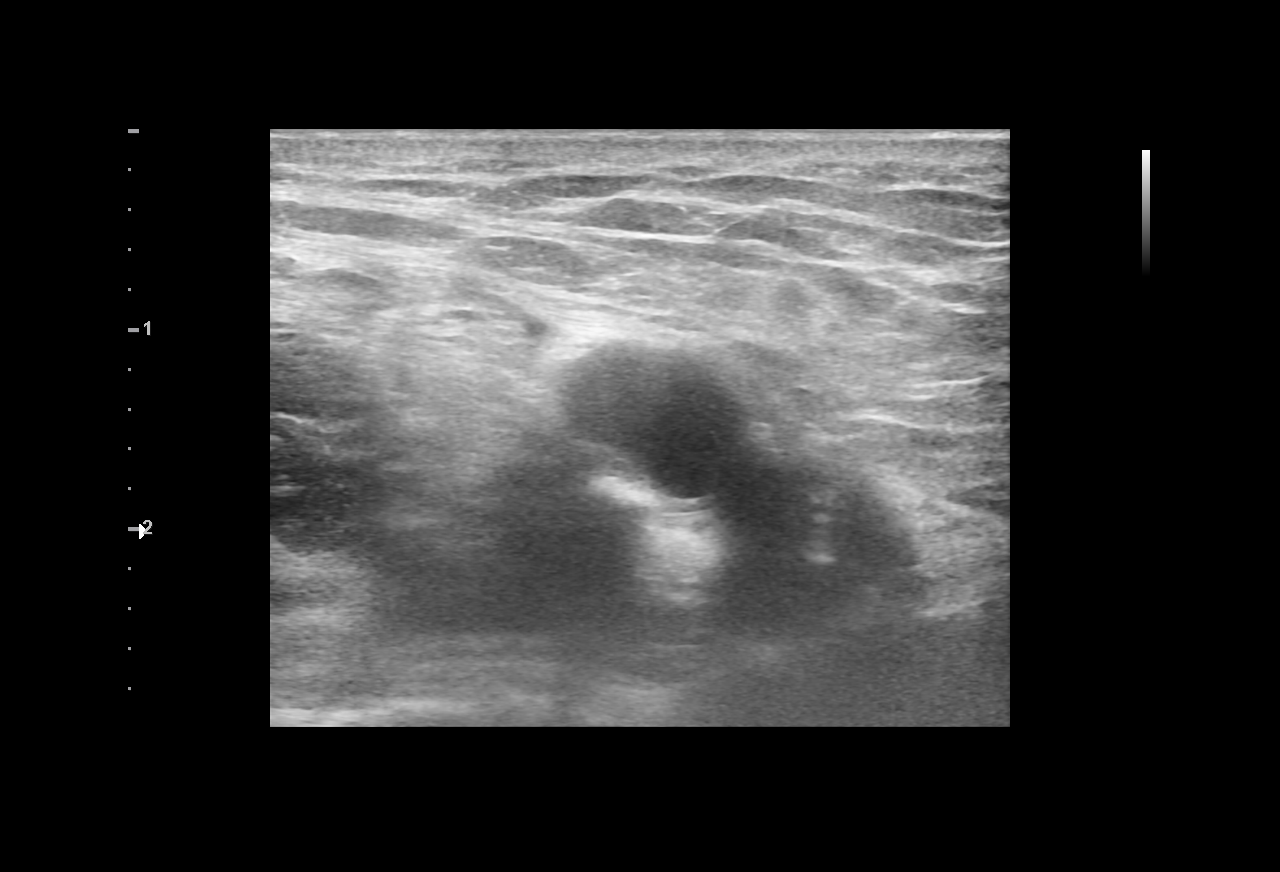

[2 of 2 positions shown; findings below may reference images not displayed]

EXAM:
1. Ultrasound-guided access of the right common femoral vein
2. Catheterization of the pulmonary artery
3. Pulmonary arterial manometry
4. Pulmonary angiography
5. Selective catheterization of the bilateral lobar and segmental
pulmonary arteries
6. Mechanical thrombectomy of the bilateral pulmonary arteries
7. Perclose closure of the right common femoral vein
MEDICATIONS:
1888 units heparin, intravenous

ANESTHESIA/SEDATION:
Versed 1 mg IV; Fentanyl 150 mcg IV

Moderate Sedation Time:  106 minutes

The patient was continuously monitored during the procedure by the
interventional radiology nurse under my direct supervision.

FLUOROSCOPY TIME:  Fluoroscopy Time: 9 minutes 42 seconds (83 mGy).

COMPLICATIONS:
None immediate.
The right groin was prepped and draped in standard fashion.
Preprocedure ultrasound evaluation demonstrated patent, compressible
right common femoral vein. The procedure was planned. Subdermal
local anesthetic was administered at the planned needle entry site
with 1% lidocaine. Approximately 1 cm skin nick was made and blunt
subcutaneous dissection was performed with curved Gonar. Under
direct ultrasound visualization, the right common femoral vein was
accessed with a 21 gauge micropuncture needle. Using the
micropuncture set, a Rosen wire was directed to the inferior vena
cava under fluoroscopic guidance. After serial dilation up to 10
French, 2 ProGlide Perclose devices were deployed at the 10 o'clock
and 2 o'clock positions of the common femoral vein. An 8 French, 11
cm sheath was then placed. A pigtail catheter is in placed over the
Rosen wire which was removed and exchanged for a tip deflecting
wire. Under constant fluoroscopic guidance, the pigtail catheter was
directed into the main pulmonary artery. Pulmonary manometry was
performed and was 59/30, mean 40 mm Hg. Next, pulmonary angiography
was performed which demonstrated multiple bilateral main, lobar, and
segmental pulmonary emboli. The pigtail catheter was exchanged for a
5 French C2 over the Rosen wire which was then directed into the
right inferior subsegmental branches. The Rosen wire was exchanged
for a superstiff, short taper Amplatz wire. Next, serial dilation
was performed and a 24 Mpaki Gradwell dry seal sheath was placed in the
right common femoral artery. Through the sheath, the 24 Premila Marini
ClotTriever mechanical thrombectomy device was directed to the right
lower lobar segmental branch. Aspiration thrombectomy was performed
with multiple passes yielding acute appearing thrombus. The yielded
samples were aspirated and fresh, filter blood was returned to the
patient via the indwelling femoral sheath. Right pulmonary
angiography was performed which demonstrated adequate clearance of
previously visualized pulmonary emboli. The indwelling wire and
thrombectomy device were then positioned in the left pulmonary
artery. Aspiration thrombectomy was performed with multiple passes
yielding acute appearing thrombus. Left pulmonary angiography was
then performed which demonstrated adequate clearance of previously
visualized pulmonary emboli. Repeat pulmonary manometry was
performed through the indwelling aspiration catheter and was 63/33,
mean 44 mm Hg.

The catheter and sheath were removed and the Perclose sutures were
approximated, tied, and cut. Hemostasis was achieved with brief,
gentle manual compression. The right groin incision was then
approximated and covered with Dermabond. The patient tolerated the
procedure well with intra procedural decreased pulse from 100s to
80s. The patient was transferred to the floor in good condition.
FINDINGS: Acute bilateral saddle pulmonary emboli and pulmonary hypertension.
Technically successful mechanical thrombectomy of bilateral
pulmonary emboli.
IMPRESSION: 1. Acute bilateral saddle pulmonary emboli and pulmonary
hypertension.
2. Technically successful mechanical thrombectomy of bilateral
pulmonary emboli.
